# Patient Record
Sex: Female | Born: 1950 | Hispanic: Refuse to answer | Marital: Married | Smoking: Former smoker
Health system: Southern US, Community
[De-identification: ages and names within clinical notes are randomized; demographics above are authoritative.]

## PROBLEM LIST (undated history)

## (undated) ENCOUNTER — Ambulatory Visit

## (undated) ENCOUNTER — Encounter

## (undated) ENCOUNTER — Telehealth

## (undated) ENCOUNTER — Ambulatory Visit: Payer: MEDICARE

## (undated) ENCOUNTER — Encounter
Attending: Student in an Organized Health Care Education/Training Program | Primary: Student in an Organized Health Care Education/Training Program

## (undated) ENCOUNTER — Ambulatory Visit
Attending: Student in an Organized Health Care Education/Training Program | Primary: Student in an Organized Health Care Education/Training Program

## (undated) ENCOUNTER — Ambulatory Visit
Payer: Medicare (Managed Care) | Attending: Student in an Organized Health Care Education/Training Program | Primary: Student in an Organized Health Care Education/Training Program

## (undated) ENCOUNTER — Ambulatory Visit
Payer: MEDICARE | Attending: Student in an Organized Health Care Education/Training Program | Primary: Student in an Organized Health Care Education/Training Program

## (undated) DIAGNOSIS — E785 Hyperlipidemia, unspecified: Secondary | ICD-10-CM

## (undated) DIAGNOSIS — I10 Essential (primary) hypertension: Secondary | ICD-10-CM

## (undated) DIAGNOSIS — M810 Age-related osteoporosis without current pathological fracture: Secondary | ICD-10-CM

## (undated) HISTORY — PX: ABDOMINAL HYSTERECTOMY: SHX81

---

## 2005-03-20 HISTORY — PX: BREAST CYST ASPIRATION: SHX578

## 2010-01-31 ENCOUNTER — Ambulatory Visit: Payer: Self-pay | Admitting: Specialist

## 2010-02-01 ENCOUNTER — Ambulatory Visit: Payer: Self-pay | Admitting: Specialist

## 2011-02-02 ENCOUNTER — Ambulatory Visit: Payer: Self-pay | Admitting: Family Medicine

## 2012-02-06 ENCOUNTER — Ambulatory Visit: Payer: Self-pay | Admitting: Family Medicine

## 2013-02-07 ENCOUNTER — Ambulatory Visit: Payer: Self-pay | Admitting: Family Medicine

## 2014-02-10 ENCOUNTER — Ambulatory Visit: Payer: Self-pay | Admitting: Family Medicine

## 2014-04-03 ENCOUNTER — Ambulatory Visit: Payer: Self-pay | Admitting: Gastroenterology

## 2014-07-13 LAB — SURGICAL PATHOLOGY

## 2015-01-21 ENCOUNTER — Other Ambulatory Visit: Payer: Self-pay | Admitting: Family Medicine

## 2015-01-21 DIAGNOSIS — Z1231 Encounter for screening mammogram for malignant neoplasm of breast: Secondary | ICD-10-CM

## 2015-02-17 ENCOUNTER — Ambulatory Visit
Admission: RE | Admit: 2015-02-17 | Discharge: 2015-02-17 | Disposition: A | Discharge status: Home / Self Care | Payer: No Typology Code available for payment source | Source: Ambulatory Visit | Attending: Family Medicine | Admitting: Family Medicine

## 2015-02-17 DIAGNOSIS — Z1231 Encounter for screening mammogram for malignant neoplasm of breast: Secondary | ICD-10-CM | POA: Insufficient documentation

## 2015-05-16 ENCOUNTER — Emergency Department
Admission: EM | Admit: 2015-05-16 | Discharge: 2015-05-16 | Disposition: A | Discharge status: Home / Self Care | Payer: No Typology Code available for payment source | Attending: Emergency Medicine | Admitting: Emergency Medicine

## 2015-05-16 ENCOUNTER — Encounter: Payer: Self-pay | Admitting: Emergency Medicine

## 2015-05-16 DIAGNOSIS — I10 Essential (primary) hypertension: Secondary | ICD-10-CM | POA: Diagnosis not present

## 2015-05-16 DIAGNOSIS — S161XXA Strain of muscle, fascia and tendon at neck level, initial encounter: Secondary | ICD-10-CM | POA: Diagnosis not present

## 2015-05-16 DIAGNOSIS — Y998 Other external cause status: Secondary | ICD-10-CM | POA: Diagnosis not present

## 2015-05-16 DIAGNOSIS — Y9389 Activity, other specified: Secondary | ICD-10-CM | POA: Diagnosis not present

## 2015-05-16 DIAGNOSIS — Y92410 Unspecified street and highway as the place of occurrence of the external cause: Secondary | ICD-10-CM | POA: Insufficient documentation

## 2015-05-16 DIAGNOSIS — S199XXA Unspecified injury of neck, initial encounter: Secondary | ICD-10-CM | POA: Diagnosis present

## 2015-05-16 HISTORY — DX: Essential (primary) hypertension: I10
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
## 2015-05-16 NOTE — ED Notes (Signed)
AAOx3.  Skin warm and dry.  Ambulates with easy and steady gait.  Moving all extremities equally and strong. 
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
## 2015-12-10 ENCOUNTER — Other Ambulatory Visit: Payer: Self-pay | Admitting: Family Medicine

## 2015-12-10 DIAGNOSIS — Z1231 Encounter for screening mammogram for malignant neoplasm of breast: Secondary | ICD-10-CM

## 2016-02-21 ENCOUNTER — Ambulatory Visit: Admission: RE | Admit: 2016-02-21 | Payer: No Typology Code available for payment source | Source: Ambulatory Visit

## 2016-02-23 ENCOUNTER — Ambulatory Visit
Admission: RE | Admit: 2016-02-23 | Discharge: 2016-02-23 | Disposition: A | Discharge status: Home / Self Care | Payer: BLUE CROSS/BLUE SHIELD | Source: Ambulatory Visit | Attending: Family Medicine | Admitting: Family Medicine

## 2016-02-23 DIAGNOSIS — Z1231 Encounter for screening mammogram for malignant neoplasm of breast: Secondary | ICD-10-CM | POA: Insufficient documentation

## 2016-04-04 ENCOUNTER — Ambulatory Visit
Admission: EM | Admit: 2016-04-04 | Discharge: 2016-04-04 | Disposition: A | Discharge status: Home / Self Care | Payer: BLUE CROSS/BLUE SHIELD | Attending: Emergency Medicine | Admitting: Emergency Medicine

## 2016-04-04 DIAGNOSIS — J069 Acute upper respiratory infection, unspecified: Secondary | ICD-10-CM

## 2016-04-04 MED ORDER — BENZONATATE 200 MG PO CAPS: 200.0000 mg | ORAL_CAPSULE | Freq: Three times a day (TID) | ORAL | 0 refills | Status: DC

## 2016-04-04 MED ORDER — CETIRIZINE-PSEUDOEPHEDRINE ER 5-120 MG PO TB12: 1.0000 | ORAL_TABLET | Freq: Two times a day (BID) | ORAL | 0 refills | Status: DC

## 2016-04-04 NOTE — ED Provider Notes (Signed)
CSN: [id number]     Arrival date & time 04/04/16  1033 History   First MD Initiated Contact with Patient 04/04/16 1317     Chief Complaint  Patient presents with  . Cough   (Consider location/radiation/quality/duration/timing/severity/associated sxs/prior Treatment) HPI   This a 66 year old female who presents with cough and congestion started 4 days ago. Also been having some sinus pressure and pain with nasal drainage. Is not coughing at nighttime excessively. She's not had chills but has felt feverish although she has not checked it. She stopped smoking 30 years ago. Her cough is productive of some Ostrovsky sputum.  Past Medical History:  Diagnosis Date  . Hypertension    Past Surgical History:  Procedure Laterality Date  . BREAST CYST ASPIRATION Left 2007   neg   Family History  Problem Relation Age of Onset  . Hypertension Mother   . CAD Mother   . CAD Father    Social History  Substance Use Topics  . Smoking status: Passive Smoke Exposure - Never Smoker  . Smokeless tobacco: Never Used  . Alcohol use No     Comment: social   OB History    No data available     Review of Systems  Constitutional: Positive for activity change, fatigue and fever. Negative for chills.  HENT: Positive for congestion, postnasal drip, sinus pain, sinus pressure and sore throat.   Respiratory: Positive for cough. Negative for shortness of breath, wheezing and stridor.   All other systems reviewed and are negative.   Allergies  Sulfa antibiotics  Home Medications   Prior to Admission medications   Medication Sig Start Date End Date Taking? Authorizing Provider  fluticasone (FLONASE) 50 MCG/ACT nasal spray Place into both nostrils daily.   Yes Historical Provider, MD  lisinopril (PRINIVIL,ZESTRIL) 20 MG tablet Take 20 mg by mouth daily.   Yes Historical Provider, MD  lovastatin (MEVACOR) 40 MG tablet Take 40 mg by mouth at bedtime.   Yes Historical Provider, MD  benzonatate (TESSALON)  200 MG capsule Take 1 capsule (200 mg total) by mouth every 8 (eight) hours. As necessary for cough 04/04/16   Lutricia FeilWilliam P Drakkar Medeiros, PA-C  cetirizine-pseudoephedrine (ZYRTEC-D) 5-120 MG tablet Take 1 tablet by mouth 2 (two) times daily. 04/04/16   Lutricia FeilWilliam P [place]ola Wheat, PA-C   Meds Ordered and Administered this Visit  Medications - No data to display  BP 138/76 (BP Location: Left Arm)   Pulse (!) 107   Temp 99.4 F (37.4 C) (Oral)   Resp 18   Ht 5' 5.5" (1.664 m)   Wt 140 lb (63.5 kg)   SpO2 100%   BMI 22.94 kg/m  No data found.   Physical Exam  Constitutional: She is oriented to person, place, and time. She appears well-developed and well-nourished. No distress.  HENT:  Head: Normocephalic and atraumatic.  Right Ear: External ear normal.  Left Ear: External ear normal.  Nose: Nose normal.  Mouth/Throat: Oropharynx is clear and moist. No oropharyngeal exudate.  Eyes: EOM are normal. Pupils are equal, round, and reactive to light. Right eye exhibits no discharge. Left eye exhibits no discharge.  Neck: Normal range of motion. Neck supple.  Pulmonary/Chest: Effort normal and breath sounds normal. No respiratory distress. She has no wheezes. She has no rales.  Musculoskeletal: Normal range of motion.  Lymphadenopathy:    She has no cervical adenopathy.  Neurological: She is alert and oriented to person, place, and time.  Skin: Skin is warm and dry. She  is not diaphoretic.  Psychiatric: She has a normal mood and affect. Her behavior is normal. Judgment and thought content normal.  Nursing note and vitals reviewed.   Urgent Care Course   Clinical Course     Procedures (including critical care time)  Labs Review Labs Reviewed - No data to display  Imaging Review No results found.   Visual Acuity Review  Right Eye Distance:   Left Eye Distance:   Bilateral Distance:    Right Eye Near:   Left Eye Near:    Bilateral Near:         MDM   1. Acute upper respiratory  infection    Discharge Medication List as of 04/04/2016  1:33 PM    START taking these medications   Details  benzonatate (TESSALON) 200 MG capsule Take 1 capsule (200 mg total) by mouth every 8 (eight) hours. As necessary for cough, Starting Tue 04/04/2016, Normal    cetirizine-pseudoephedrine (ZYRTEC-D) 5-120 MG tablet Take 1 tablet by mouth 2 (two) times daily., Starting Tue 04/04/2016, Normal      Plan: 1. Test/x-ray results and diagnosis reviewed with patient 2. rx as per orders; risks, benefits, potential side effects reviewed with patient 3. Recommend supportive treatment with Rest and fluids. Use Flonase daily for the next 3-4 weeks. Follow-up with the primary care physician if she continues to have symptoms 4. F/u prn if symptoms worsen or don't improve     Lutricia Feil, PA-C 04/04/16 1342

## 2016-04-04 NOTE — ED Triage Notes (Signed)
Patient complains of cough and congestion that started on Friday. Patient states that she has also had sinus pain and pressure. Patient states that she feels like the cold has settled into her chest.

## 2016-04-14 ENCOUNTER — Encounter: Payer: Self-pay | Admitting: Emergency Medicine

## 2016-04-14 ENCOUNTER — Ambulatory Visit
Admission: EM | Admit: 2016-04-14 | Discharge: 2016-04-14 | Disposition: A | Discharge status: Home / Self Care | Payer: BLUE CROSS/BLUE SHIELD | Attending: Family Medicine | Admitting: Family Medicine

## 2016-04-14 DIAGNOSIS — J40 Bronchitis, not specified as acute or chronic: Secondary | ICD-10-CM

## 2016-04-14 DIAGNOSIS — R059 Cough, unspecified: Secondary | ICD-10-CM

## 2016-04-14 DIAGNOSIS — R05 Cough: Secondary | ICD-10-CM | POA: Diagnosis not present

## 2016-04-14 MED ORDER — AZITHROMYCIN 250 MG PO TABS: ORAL_TABLET | ORAL | 0 refills | Status: DC

## 2016-04-14 MED ORDER — GUAIFENESIN-CODEINE 100-10 MG/5ML PO SOLN: ORAL | 0 refills | Status: DC

## 2016-04-14 NOTE — ED Provider Notes (Signed)
[facility]    CSN: [id number] Arrival date & time: 04/14/16  4098     History   Chief Complaint Chief Complaint  Patient presents with  . Cough    HPI Debra Alexander is a 66 y.o. female.   The history is provided by the patient.  Cough  Associated symptoms: fever   Associated symptoms: no headaches and no wheezing   URI  Presenting symptoms: cough, fatigue and fever   Severity:  Moderate Onset quality:  Sudden Duration:  14 days Timing:  Constant Progression:  Worsening Chronicity:  New Relieved by:  Nothing Ineffective treatments:  Prescription medications and OTC medications Associated symptoms: no headaches, no sinus pain and no wheezing   Risk factors: sick contacts   Risk factors: not elderly, no chronic cardiac disease, no chronic kidney disease, no chronic respiratory disease, no diabetes mellitus, no immunosuppression, no recent illness and no recent travel     Past Medical History:  Diagnosis Date  . Hypertension     There are no active problems to display for this patient.   Past Surgical History:  Procedure Laterality Date  . BREAST CYST ASPIRATION Left 2007   neg    OB History    No data available       Home Medications    Prior to Admission medications   Medication Sig Start Date End Date Taking? Authorizing Provider  azithromycin (ZITHROMAX Z-PAK) 250 MG tablet 2 tabs po once day 1, then 1 tab po qd for next 4 days 04/14/16   Payton Mccallum, MD  benzonatate (TESSALON) 200 MG capsule Take 1 capsule (200 mg total) by mouth every 8 (eight) hours. As necessary for cough 04/04/16   Lutricia Feil, PA-C  cetirizine-pseudoephedrine (ZYRTEC-D) 5-120 MG tablet Take 1 tablet by mouth 2 (two) times daily. 04/04/16   Lutricia Feil, PA-C  fluticasone (FLONASE) 50 MCG/ACT nasal spray Place into both nostrils daily.    Historical Provider, MD  guaiFENesin-codeine 100-10 MG/5ML syrup 10 mls po bid prn cough 04/14/16   Payton Mccallum, MD    lisinopril (PRINIVIL,ZESTRIL) 20 MG tablet Take 20 mg by mouth daily.    Historical Provider, MD  lovastatin (MEVACOR) 40 MG tablet Take 40 mg by mouth at bedtime.    Historical Provider, MD    Family History Family History  Problem Relation Age of Onset  . Hypertension Mother   . CAD Mother   . CAD Father     Social History Social History  Substance Use Topics  . Smoking status: Passive Smoke Exposure - Never Smoker  . Smokeless tobacco: Never Used  . Alcohol use No     Comment: social     Allergies   Sulfa antibiotics   Review of Systems Review of Systems  Constitutional: Positive for fatigue and fever.  HENT: Negative for sinus pain.   Respiratory: Positive for cough. Negative for wheezing.   Neurological: Negative for headaches.     Physical Exam Triage Vital Signs ED Triage Vitals  Enc Vitals Group     BP 04/14/16 0955 134/84     Pulse Rate 04/14/16 0955 97     Resp 04/14/16 0955 16     Temp 04/14/16 0955 99.8 F (37.7 C)     Temp Source 04/14/16 0955 Oral     SpO2 04/14/16 0955 98 %     Weight 04/14/16 0954 140 lb (63.5 kg)     Height 04/14/16 0954 5\' 5"  (1.651 m)  Head Circumference --      Peak Flow --      Pain Score 04/14/16 0955 0     Pain Loc --      Pain Edu? --      Excl. in GC? --    No data found.   Updated Vital Signs BP 134/84 (BP Location: Right Arm)   Pulse 97   Temp 99.8 F (37.7 C) (Oral)   Resp 16   Ht 5\' 5"  (1.651 m)   Wt 140 lb (63.5 kg)   SpO2 98%   BMI 23.30 kg/m   Visual Acuity Right Eye Distance:   Left Eye Distance:   Bilateral Distance:    Right Eye Near:   Left Eye Near:    Bilateral Near:     Physical Exam  Constitutional: She appears well-developed and well-nourished. No distress.  HENT:  Head: Normocephalic and atraumatic.  Right Ear: Tympanic membrane, external ear and ear canal normal.  Left Ear: Tympanic membrane, external ear and ear canal normal.  Nose: Mucosal edema and rhinorrhea  present. No nose lacerations, sinus tenderness, nasal deformity, septal deviation or nasal septal hematoma. No epistaxis.  No foreign bodies. Right sinus exhibits maxillary sinus tenderness and frontal sinus tenderness. Left sinus exhibits maxillary sinus tenderness and frontal sinus tenderness.  Mouth/Throat: Uvula is midline, oropharynx is clear and moist and mucous membranes are normal. No oropharyngeal exudate.  Eyes: Conjunctivae and EOM are normal. Pupils are equal, round, and reactive to light. Right eye exhibits no discharge. Left eye exhibits no discharge. No scleral icterus.  Neck: Normal range of motion. Neck supple. No thyromegaly present.  Cardiovascular: Normal rate, regular rhythm and normal heart sounds.   Pulmonary/Chest: Effort normal. No respiratory distress. She has no wheezes. She has rales (left base).  Lymphadenopathy:    She has no cervical adenopathy.  Skin: She is not diaphoretic.  Nursing note and vitals reviewed.    UC Treatments / Results  Labs (all labs ordered are listed, but only abnormal results are displayed) Labs Reviewed - No data to display  EKG  EKG Interpretation None       Radiology No results found.  Procedures Procedures (including critical care time)  Medications Ordered in UC Medications - No data to display   Initial Impression / Assessment and Plan / UC Course  I have reviewed the triage vital signs and the nursing notes.  Pertinent labs & imaging results that were available during my care of the patient were reviewed by me and considered in my medical decision making (see chart for details).       Final Clinical Impressions(s) / UC Diagnoses   Final diagnoses:  Cough  Bronchitis    New Prescriptions Discharge Medication List as of 04/14/2016 10:18 AM    START taking these medications   Details  azithromycin (ZITHROMAX Z-PAK) 250 MG tablet 2 tabs po once day 1, then 1 tab po qd for next 4 days, Normal      guaiFENesin-codeine 100-10 MG/5ML syrup 10 mls po bid prn cough, Print       1. diagnosis reviewed with patient 2. rx as per orders above; reviewed possible side effects, interactions, risks and benefits  3. Recommend supportive treatment with rest, fluids 4. Follow-up prn if symptoms worsen or don't improve   Payton Mccallumrlando Belisa Eichholz, MD 04/14/16 1217

## 2016-04-14 NOTE — ED Triage Notes (Signed)
Patient c/o cough and congestion for over a week.  Patient states that she was seen on Jan. 16 and is not getting any better. Patient denies fevers.

## 2017-01-24 ENCOUNTER — Other Ambulatory Visit: Payer: Self-pay | Admitting: Family Medicine

## 2017-01-24 DIAGNOSIS — Z1231 Encounter for screening mammogram for malignant neoplasm of breast: Secondary | ICD-10-CM

## 2017-03-07 ENCOUNTER — Ambulatory Visit
Admission: RE | Admit: 2017-03-07 | Discharge: 2017-03-07 | Disposition: A | Discharge status: Home / Self Care | Payer: Medicare HMO | Source: Ambulatory Visit | Attending: Family Medicine | Admitting: Family Medicine

## 2017-03-07 DIAGNOSIS — Z1231 Encounter for screening mammogram for malignant neoplasm of breast: Secondary | ICD-10-CM

## 2017-03-14 DIAGNOSIS — R87610 Atypical squamous cells of undetermined significance on cytologic smear of cervix (ASC-US): Secondary | ICD-10-CM | POA: Diagnosis not present

## 2017-03-14 DIAGNOSIS — M25551 Pain in right hip: Secondary | ICD-10-CM | POA: Diagnosis not present

## 2017-03-14 DIAGNOSIS — Z8541 Personal history of malignant neoplasm of cervix uteri: Secondary | ICD-10-CM | POA: Diagnosis not present

## 2017-03-14 DIAGNOSIS — I10 Essential (primary) hypertension: Secondary | ICD-10-CM | POA: Diagnosis not present

## 2017-03-14 DIAGNOSIS — N951 Menopausal and female climacteric states: Secondary | ICD-10-CM | POA: Diagnosis not present

## 2017-03-14 DIAGNOSIS — J302 Other seasonal allergic rhinitis: Secondary | ICD-10-CM | POA: Diagnosis not present

## 2017-03-14 DIAGNOSIS — K219 Gastro-esophageal reflux disease without esophagitis: Secondary | ICD-10-CM | POA: Diagnosis not present

## 2017-03-14 DIAGNOSIS — Z01419 Encounter for gynecological examination (general) (routine) without abnormal findings: Secondary | ICD-10-CM | POA: Diagnosis not present

## 2017-03-14 DIAGNOSIS — E7800 Pure hypercholesterolemia, unspecified: Secondary | ICD-10-CM | POA: Diagnosis not present

## 2017-03-14 DIAGNOSIS — Z23 Encounter for immunization: Secondary | ICD-10-CM | POA: Diagnosis not present

## 2017-03-16 DIAGNOSIS — Z01419 Encounter for gynecological examination (general) (routine) without abnormal findings: Secondary | ICD-10-CM | POA: Diagnosis not present

## 2017-03-16 DIAGNOSIS — Z79899 Other long term (current) drug therapy: Secondary | ICD-10-CM | POA: Diagnosis not present

## 2017-03-16 DIAGNOSIS — I10 Essential (primary) hypertension: Secondary | ICD-10-CM | POA: Diagnosis not present

## 2017-03-16 DIAGNOSIS — L659 Nonscarring hair loss, unspecified: Secondary | ICD-10-CM | POA: Diagnosis not present

## 2017-03-16 DIAGNOSIS — E7800 Pure hypercholesterolemia, unspecified: Secondary | ICD-10-CM | POA: Diagnosis not present

## 2017-04-10 DIAGNOSIS — R87629 Unspecified abnormal cytological findings in specimens from vagina: Secondary | ICD-10-CM | POA: Diagnosis not present

## 2017-10-09 DIAGNOSIS — H524 Presbyopia: Secondary | ICD-10-CM | POA: Diagnosis not present

## 2017-10-10 DIAGNOSIS — Z0100 Encounter for examination of eyes and vision without abnormal findings: Secondary | ICD-10-CM | POA: Diagnosis not present

## 2017-10-11 DIAGNOSIS — E7800 Pure hypercholesterolemia, unspecified: Secondary | ICD-10-CM | POA: Diagnosis not present

## 2017-10-11 DIAGNOSIS — J302 Other seasonal allergic rhinitis: Secondary | ICD-10-CM | POA: Diagnosis not present

## 2017-10-11 DIAGNOSIS — K219 Gastro-esophageal reflux disease without esophagitis: Secondary | ICD-10-CM | POA: Diagnosis not present

## 2017-10-11 DIAGNOSIS — I10 Essential (primary) hypertension: Secondary | ICD-10-CM | POA: Diagnosis not present

## 2018-06-05 DIAGNOSIS — Z8669 Personal history of other diseases of the nervous system and sense organs: Secondary | ICD-10-CM | POA: Diagnosis not present

## 2018-06-05 DIAGNOSIS — N951 Menopausal and female climacteric states: Secondary | ICD-10-CM | POA: Diagnosis not present

## 2018-06-05 DIAGNOSIS — K219 Gastro-esophageal reflux disease without esophagitis: Secondary | ICD-10-CM | POA: Diagnosis not present

## 2018-06-05 DIAGNOSIS — Z131 Encounter for screening for diabetes mellitus: Secondary | ICD-10-CM | POA: Diagnosis not present

## 2018-06-05 DIAGNOSIS — Z87891 Personal history of nicotine dependence: Secondary | ICD-10-CM | POA: Diagnosis not present

## 2018-06-05 DIAGNOSIS — J302 Other seasonal allergic rhinitis: Secondary | ICD-10-CM | POA: Diagnosis not present

## 2018-06-05 DIAGNOSIS — D709 Neutropenia, unspecified: Secondary | ICD-10-CM | POA: Diagnosis not present

## 2018-06-05 DIAGNOSIS — Z0000 Encounter for general adult medical examination without abnormal findings: Secondary | ICD-10-CM | POA: Diagnosis not present

## 2018-06-05 DIAGNOSIS — E7800 Pure hypercholesterolemia, unspecified: Secondary | ICD-10-CM | POA: Diagnosis not present

## 2018-06-05 DIAGNOSIS — G4700 Insomnia, unspecified: Secondary | ICD-10-CM | POA: Diagnosis not present

## 2018-06-05 DIAGNOSIS — I10 Essential (primary) hypertension: Secondary | ICD-10-CM | POA: Diagnosis not present

## 2018-06-05 DIAGNOSIS — M25552 Pain in left hip: Secondary | ICD-10-CM | POA: Diagnosis not present

## 2018-06-07 ENCOUNTER — Other Ambulatory Visit: Payer: Self-pay | Admitting: Gerontology

## 2018-06-07 DIAGNOSIS — Z0000 Encounter for general adult medical examination without abnormal findings: Secondary | ICD-10-CM

## 2018-09-17 DIAGNOSIS — M810 Age-related osteoporosis without current pathological fracture: Secondary | ICD-10-CM | POA: Diagnosis not present

## 2018-12-12 DIAGNOSIS — Z8541 Personal history of malignant neoplasm of cervix uteri: Secondary | ICD-10-CM | POA: Diagnosis not present

## 2018-12-12 DIAGNOSIS — Z0000 Encounter for general adult medical examination without abnormal findings: Secondary | ICD-10-CM | POA: Diagnosis not present

## 2018-12-12 DIAGNOSIS — D709 Neutropenia, unspecified: Secondary | ICD-10-CM | POA: Diagnosis not present

## 2018-12-12 DIAGNOSIS — Z1329 Encounter for screening for other suspected endocrine disorder: Secondary | ICD-10-CM | POA: Diagnosis not present

## 2018-12-12 DIAGNOSIS — G4700 Insomnia, unspecified: Secondary | ICD-10-CM | POA: Diagnosis not present

## 2018-12-12 DIAGNOSIS — Z87891 Personal history of nicotine dependence: Secondary | ICD-10-CM | POA: Diagnosis not present

## 2018-12-12 DIAGNOSIS — E7800 Pure hypercholesterolemia, unspecified: Secondary | ICD-10-CM | POA: Diagnosis not present

## 2018-12-12 DIAGNOSIS — M810 Age-related osteoporosis without current pathological fracture: Secondary | ICD-10-CM | POA: Diagnosis not present

## 2018-12-12 DIAGNOSIS — K219 Gastro-esophageal reflux disease without esophagitis: Secondary | ICD-10-CM | POA: Diagnosis not present

## 2018-12-12 DIAGNOSIS — I10 Essential (primary) hypertension: Secondary | ICD-10-CM | POA: Diagnosis not present

## 2018-12-12 DIAGNOSIS — J302 Other seasonal allergic rhinitis: Secondary | ICD-10-CM | POA: Diagnosis not present

## 2018-12-12 DIAGNOSIS — R7309 Other abnormal glucose: Secondary | ICD-10-CM | POA: Diagnosis not present

## 2018-12-12 DIAGNOSIS — Z7189 Other specified counseling: Secondary | ICD-10-CM | POA: Diagnosis not present

## 2019-02-07 ENCOUNTER — Other Ambulatory Visit: Payer: Self-pay

## 2019-02-07 DIAGNOSIS — Z20822 Contact with and (suspected) exposure to covid-19: Secondary | ICD-10-CM

## 2019-02-10 LAB — NOVEL CORONAVIRUS, NAA: SARS-CoV-2, NAA: NOT DETECTED

## 2019-04-14 ENCOUNTER — Encounter: Admit: 2019-04-14 | Discharge: 2019-04-15 | Payer: MEDICARE

## 2019-05-12 ENCOUNTER — Encounter: Admit: 2019-05-12 | Discharge: 2019-05-13 | Payer: MEDICARE

## 2019-06-11 DIAGNOSIS — M810 Age-related osteoporosis without current pathological fracture: Secondary | ICD-10-CM | POA: Diagnosis not present

## 2019-06-11 DIAGNOSIS — K219 Gastro-esophageal reflux disease without esophagitis: Secondary | ICD-10-CM | POA: Diagnosis not present

## 2019-06-11 DIAGNOSIS — J302 Other seasonal allergic rhinitis: Secondary | ICD-10-CM | POA: Diagnosis not present

## 2019-06-11 DIAGNOSIS — R7303 Prediabetes: Secondary | ICD-10-CM | POA: Diagnosis not present

## 2019-06-11 DIAGNOSIS — G4700 Insomnia, unspecified: Secondary | ICD-10-CM | POA: Diagnosis not present

## 2019-06-11 DIAGNOSIS — I10 Essential (primary) hypertension: Secondary | ICD-10-CM | POA: Diagnosis not present

## 2019-06-11 DIAGNOSIS — Z1239 Encounter for other screening for malignant neoplasm of breast: Secondary | ICD-10-CM | POA: Diagnosis not present

## 2019-06-11 DIAGNOSIS — Z7189 Other specified counseling: Secondary | ICD-10-CM | POA: Diagnosis not present

## 2019-06-11 DIAGNOSIS — Z87891 Personal history of nicotine dependence: Secondary | ICD-10-CM | POA: Diagnosis not present

## 2019-06-12 ENCOUNTER — Other Ambulatory Visit: Payer: Self-pay | Admitting: Gerontology

## 2019-06-12 DIAGNOSIS — Z1231 Encounter for screening mammogram for malignant neoplasm of breast: Secondary | ICD-10-CM

## 2019-08-25 ENCOUNTER — Ambulatory Visit
Admission: EM | Admit: 2019-08-25 | Discharge: 2019-08-25 | Disposition: A | Discharge status: Home / Self Care | Payer: Medicare Other | Attending: Family Medicine | Admitting: Family Medicine

## 2019-08-25 ENCOUNTER — Other Ambulatory Visit: Payer: Self-pay

## 2019-08-25 DIAGNOSIS — J069 Acute upper respiratory infection, unspecified: Secondary | ICD-10-CM | POA: Diagnosis not present

## 2019-08-25 MED ORDER — IPRATROPIUM BROMIDE 0.06 % NA SOLN: 2.0000 | Freq: Four times a day (QID) | NASAL | 0 refills | Status: DC | PRN

## 2019-08-25 NOTE — ED Triage Notes (Signed)
Pt states facial pressure/pain and right ear pain since Saturday

## 2019-08-25 NOTE — Discharge Instructions (Signed)
Daily Zyrtec.  Atrovent as prescribed.  Lots of fluids.  Nasal saline as well.  Take care  Dr. Adriana Simas

## 2019-08-25 NOTE — ED Provider Notes (Signed)
[facility]    CSN: [id number] Arrival date & time: 08/25/19  0849  History   Chief Complaint Chief Complaint  Patient presents with   Facial Pain   Otalgia   HPI 69 year old female presents with the above complaints.  Symptoms started on Saturday.  Patient reports sinus pressure and pain as well as right ear pain.  Also reports rhinorrhea and congestion.  No fever.  She has been taking Tylenol Cold and sinus and Flonase without resolution.  No known inciting factor.  No known exacerbating factors.  No other associated symptoms.  No other complaints.  Past Medical History:  Diagnosis Date   Hypertension    Past Surgical History:  Procedure Laterality Date   ABDOMINAL HYSTERECTOMY     BREAST CYST ASPIRATION Left 2007   neg    OB History   No obstetric history on file.      Home Medications    Prior to Admission medications   Medication Sig Start Date End Date Taking? Authorizing Provider  alendronate (FOSAMAX) 70 MG tablet Take by mouth. 12/12/18 12/12/19 Yes [provider]  metoprolol succinate (TOPROL-XL) 25 MG 24 hr tablet TAKE 1 TABLET BY MOUTH EVERY DAY 09/29/10  Yes [provider]  fluticasone (FLONASE) 50 MCG/ACT nasal spray Place into both nostrils daily.    [provider]  ipratropium (ATROVENT) 0.06 % nasal spray Place 2 sprays into both nostrils 4 (four) times daily as needed for rhinitis. 08/25/19   Tommie Sams, DO  lisinopril (PRINIVIL,ZESTRIL) 20 MG tablet Take 20 mg by mouth daily.    [provider]  lovastatin (MEVACOR) 40 MG tablet Take 40 mg by mouth at bedtime.    [provider]    Family History Family History  Problem Relation Age of Onset   Hypertension Mother    CAD Mother    CAD Father    Breast cancer Neg Hx     Social History Social History   Tobacco Use   Smoking status: Former Smoker   Smokeless tobacco: Never Used  Substance Use Topics   Alcohol use: Yes   Comment: social   Drug use: No     Allergies   Sulfa antibiotics   Review of Systems Review of Systems  Constitutional: Negative for fever.  HENT: Positive for congestion, rhinorrhea, sinus pressure and sinus pain.    Physical Exam Triage Vital Signs ED Triage Vitals  Enc Vitals Group     BP 08/25/19 0905 (!) 185/113     Pulse Rate 08/25/19 0905 93     Resp 08/25/19 0905 16     Temp 08/25/19 0905 99 F (37.2 C)     Temp Source 08/25/19 0905 Oral     SpO2 08/25/19 0905 100 %     Weight 08/25/19 0907 153 lb (69.4 kg)     Height 08/25/19 0907 5\' 4"  (1.626 m)     Head Circumference --      Peak Flow --      Pain Score 08/25/19 0907 1     Pain Loc --      Pain Edu? --      Excl. in GC? --    Updated Vital Signs BP (!) 185/113 (BP Location: Left Arm) Comment: HAS NOT TAKEN EITHER B/P MED YET TODAY   Pulse 93    Temp 99 F (37.2 C) (Oral)    Resp 16    Ht 5\' 4"  (1.626 m)    Wt 69.4 kg  SpO2 100%    BMI 26.26 kg/m   Visual Acuity Right Eye Distance:   Left Eye Distance:   Bilateral Distance:    Right Eye Near:   Left Eye Near:    Bilateral Near:     Physical Exam Constitutional:      General: She is not in acute distress.    Appearance: Normal appearance. She is not ill-appearing.  HENT:     Head: Normocephalic and atraumatic.     Right Ear: Tympanic membrane normal.     Left Ear: Tympanic membrane normal.     Nose: No rhinorrhea.  Eyes:     General:        Right eye: No discharge.        Left eye: No discharge.     Conjunctiva/sclera: Conjunctivae normal.  Cardiovascular:     Rate and Rhythm: Normal rate and regular rhythm.     Heart sounds: No murmur.  Pulmonary:     Effort: Pulmonary effort is normal.     Breath sounds: Normal breath sounds. No wheezing, rhonchi or rales.  Neurological:     Mental Status: She is alert.  Psychiatric:        Mood and Affect: Mood normal.        Behavior: Behavior normal.    UC Treatments / Results  Labs (all  labs ordered are listed, but only abnormal results are displayed) Labs Reviewed - No data to display  EKG   Radiology No results found.  Procedures Procedures (including critical care time)  Medications Ordered in UC Medications - No data to display  Initial Impression / Assessment and Plan / UC Course  I have reviewed the triage vital signs and the nursing notes.  Pertinent labs & imaging results that were available during my care of the patient were reviewed by me and considered in my medical decision making (see chart for details).    69 year old female presents with viral URI.  Atrovent nasal spray as prescribed.  Daily Zyrtec.  Supportive care.  Final Clinical Impressions(s) / UC Diagnoses   Final diagnoses:  Upper respiratory tract infection, unspecified type     Discharge Instructions     Daily Zyrtec.  Atrovent as prescribed.  Lots of fluids.  Nasal saline as well.  Take care  Dr. Lacinda Axon    ED Prescriptions    Medication Sig Dispense Auth. Provider   ipratropium (ATROVENT) 0.06 % nasal spray Place 2 sprays into both nostrils 4 (four) times daily as needed for rhinitis. 15 mL Coral Spikes, DO     PDMP not reviewed this encounter.   Coral Spikes, [place] 08/25/19 1105

## 2019-11-12 ENCOUNTER — Encounter: Payer: Self-pay | Admitting: Emergency Medicine

## 2019-11-12 ENCOUNTER — Other Ambulatory Visit: Payer: Self-pay

## 2019-11-12 ENCOUNTER — Ambulatory Visit
Admission: EM | Admit: 2019-11-12 | Discharge: 2019-11-12 | Disposition: A | Discharge status: Home / Self Care | Payer: Medicare Other | Attending: Family Medicine | Admitting: Family Medicine

## 2019-11-12 DIAGNOSIS — L255 Unspecified contact dermatitis due to plants, except food: Secondary | ICD-10-CM | POA: Diagnosis not present

## 2019-11-12 HISTORY — DX: Hyperlipidemia, unspecified: E78.5

## 2019-11-12 HISTORY — DX: Age-related osteoporosis without current pathological fracture: M81.0

## 2019-11-12 MED ORDER — PREDNISONE 10 MG PO TABS: ORAL_TABLET | ORAL | 0 refills | Status: DC

## 2019-11-12 NOTE — ED Triage Notes (Addendum)
Patient in today c/o 5 day history of rash/hives to her hands and arms. Patient has been applying OTC Hydrocortisone cream and has been mainly using Triamcinolone Cream. Patient denies any new soaps, lotions, detergents. Patient states she started taking a new vitamin mid-July.

## 2019-11-12 NOTE — ED Provider Notes (Signed)
[facility]    CSN: [id number] Arrival date & time: 11/12/19  8466      History   Chief Complaint Chief Complaint  Patient presents with  . Rash   HPI  69 year old female presents with a rash.  Started on Friday and has not improved.  It is located on her arms and hands.  She has been applying hydrocortisone and triamcinolone without relief.  Rash is pruritic. No new exposures or changes.  No known exacerbating factors.  No other reported symptoms.  No other complaints.  Past Medical History:  Diagnosis Date  . Hyperlipidemia   . Hypertension   . Osteoporosis    Past Surgical History:  Procedure Laterality Date  . ABDOMINAL HYSTERECTOMY    . BREAST CYST ASPIRATION Left 2007   neg    OB History   No obstetric history on file.      Home Medications    Prior to Admission medications   Medication Sig Start Date End Date Taking? Authorizing Provider  alendronate (FOSAMAX) 70 MG tablet Take by mouth. 12/12/18 12/12/19 Yes [provider]  Calcium Carb-Cholecalciferol 600-400 MG-UNIT CAPS Take by mouth. 10/15/12  Yes [provider]  fluticasone (FLONASE) 50 MCG/ACT nasal spray Place into both nostrils daily.   Yes [provider]  ipratropium (ATROVENT) 0.06 % nasal spray Place 2 sprays into both nostrils 4 (four) times daily as needed for rhinitis. 08/25/19  Yes Barbara Ahart G, DO  lisinopril (PRINIVIL,ZESTRIL) 20 MG tablet Take 20 mg by mouth daily.   Yes [provider]  lovastatin (MEVACOR) 40 MG tablet Take 40 mg by mouth at bedtime.   Yes [provider]  metoprolol succinate (TOPROL-XL) 25 MG 24 hr tablet TAKE 1 TABLET BY MOUTH EVERY DAY 09/29/10  Yes [provider]  Multiple Vitamin (MULTIVITAMIN) tablet Take 1 tablet by mouth daily.   Yes [provider]  triamcinolone cream (KENALOG) 0.1 % Apply thin film topically 2 times daily as needed to eczema like skin rash 04/18/19  Yes [provider]  predniSONE (DELTASONE) 10 MG tablet 50 mg daily x 3 days, then 40 mg daily x 3 days, then 30 mg daily x 3 days, then 20 mg daily x 3 days, then 10 mg daily x 3 days. 11/12/19   Tommie Sams, DO    Family History Family History  Problem Relation Age of Onset  . Hypertension Mother   . CAD Mother   . CAD Father   . Breast cancer Neg Hx     Social History Social History   Tobacco Use  . Smoking status: Former Smoker    Packs/day: 1.00    Years: 25.00    Pack years: 25.00    Types: Cigarettes    Quit date: 11/11/1989    Years since quitting: 30.0  . Smokeless tobacco: Never Used  Vaping Use  . Vaping Use: Never used  Substance Use Topics  . Alcohol use: Yes    Comment: social  . Drug use: No     Allergies   Azithromycin, Atorvastatin, Sulfa antibiotics, Zolpidem, and Amoxicillin   Review of Systems Review of Systems  Constitutional: Negative.   Skin: Positive for rash.   Physical Exam Triage Vital Signs ED Triage Vitals [11/12/19 0847]  Enc Vitals Group     BP (!) 173/103     Pulse Rate 96     Resp 18     Temp 98.8 F (37.1 C)  Temp Source Oral     SpO2 100 %     Weight 150 lb (68 kg)     Height 5\' 4"  (1.626 m)     Head Circumference      Peak Flow      Pain Score 0     Pain Loc      Pain Edu?      Excl. in GC?    Updated Vital Signs BP (!) 175/108 (BP Location: Right Arm)   Pulse 96   Temp 98.8 F (37.1 C) (Oral)   Resp 18   Ht 5\' 4"  (1.626 m)   Wt 68 kg   SpO2 100%   BMI 25.75 kg/m   Visual Acuity Right Eye Distance:   Left Eye Distance:   Bilateral Distance:    Right Eye Near:   Left Eye Near:    Bilateral Near:     Physical Exam Vitals and nursing note reviewed.  Constitutional:      General: She is not in acute distress.    Appearance: Normal appearance. She is not ill-appearing.  HENT:     Head: Normocephalic and atraumatic.  Eyes:     General:        Right eye: No discharge.        Left eye: No  discharge.     Conjunctiva/sclera: Conjunctivae normal.  Pulmonary:     Effort: Pulmonary effort is normal. No respiratory distress.  Skin:    Comments: Hands, Forearms, and upper arms with erythematous vesicular rash.  Neurological:     Mental Status: She is alert.  Psychiatric:        Mood and Affect: Mood normal.        Behavior: Behavior normal.    UC Treatments / Results  Labs (all labs ordered are listed, but only abnormal results are displayed) Labs Reviewed - No data to display  EKG   Radiology No results found.  Procedures Procedures (including critical care time)  Medications Ordered in UC Medications - No data to display  Initial Impression / Assessment and Plan / UC Course  I have reviewed the triage vital signs and the nursing notes.  Pertinent labs & imaging results that were available during my care of the patient were reviewed by me and considered in my medical decision making (see chart for details).    69 year old female presents with a vesicular rash which appears to be consistent with poison oak/ivy/sumac.  Treating with prednisone.  Final Clinical Impressions(s) / UC Diagnoses   Final diagnoses:  Dermatitis due to plants, including poison ivy, sumac, and oak   Discharge Instructions   None    ED Prescriptions    Medication Sig Dispense Auth. Provider   predniSONE (DELTASONE) 10 MG tablet 50 mg daily x 3 days, then 40 mg daily x 3 days, then 30 mg daily x 3 days, then 20 mg daily x 3 days, then 10 mg daily x 3 days. 45 tablet , DO     PDMP not reviewed this encounter.   [address], [place] Other 11/12/19 [phone]

## 2020-03-08 ENCOUNTER — Other Ambulatory Visit: Payer: Self-pay

## 2020-03-08 ENCOUNTER — Encounter: Payer: Self-pay | Admitting: Emergency Medicine

## 2020-03-08 ENCOUNTER — Ambulatory Visit
Admission: EM | Admit: 2020-03-08 | Discharge: 2020-03-08 | Disposition: A | Discharge status: Home / Self Care | Payer: Medicare Other | Attending: Sports Medicine | Admitting: Sports Medicine

## 2020-03-08 DIAGNOSIS — T7840XA Allergy, unspecified, initial encounter: Secondary | ICD-10-CM | POA: Diagnosis not present

## 2020-03-08 DIAGNOSIS — R21 Rash and other nonspecific skin eruption: Secondary | ICD-10-CM

## 2020-03-08 MED ORDER — PREDNISONE 10 MG PO TABS: ORAL_TABLET | ORAL | 0 refills | Status: DC

## 2020-03-08 MED ORDER — METHYLPREDNISOLONE SODIUM SUCC 125 MG IJ SOLR
125.0000 mg | Freq: Once | INTRAMUSCULAR | Status: AC
  Administered 2020-03-08: 09:00:00 125 mg via INTRAMUSCULAR

## 2020-03-08 NOTE — ED Triage Notes (Signed)
Pt c/o rash on bilateral arms, legs and chest. Started about 4 days ago. Rash is red, raised and itchy. Pt denies any new medications, soaps or detergents.

## 2020-03-08 NOTE — Discharge Instructions (Addendum)
Take the prednisone Dosepak as prescribed.  Continue with over-the-counter medications including hydrocortisone, Benadryl cream, oatmeal, and calamine lotion.  Tylenol or Motrin for fever discomfort.  Should you have any worsening rash, open wounds or active infection, or you feel your throat closing or tightening then you should seek out immediate medical attention.  Return here or see your PCP if symptoms were to worsen or not improve.

## 2020-03-08 NOTE — ED Provider Notes (Signed)
[facility]    CSN: [id number] Arrival date & time: 03/08/20  0807      History   Chief Complaint Chief Complaint  Patient presents with  . Rash    HPI Debra Alexander is a 69 y.o. female.   Patient is a pleasant 69 year old female who presents for evaluation of a generalized rash.  She noticed it on Friday, 17 December.  She does not recall any exposure.  It started on her arms but it spread over her legs her abdomen and into her groin.  It is quite pruritic.  No open wounds or active infection noted by the patient.  Patient does have some baseline eczema and has been using hydrocortisone cream with limited success.  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  No fever shakes chills nausea vomiting or diarrhea noted.  No chest pain shortness of breath.  She does not feel that her throat is tightening.  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  She denies any new detergents, soaps, perfumes or body sprays. Patient lives with her husband and he does not have similar symptoms.  No other issues or problems are offered.     Past Medical History:  Diagnosis Date  . Hyperlipidemia   . Hypertension   . Osteoporosis     There are no problems to display for this patient.   Past Surgical History:  Procedure Laterality Date  . ABDOMINAL HYSTERECTOMY    . BREAST CYST ASPIRATION Left 2007   neg    OB History   No obstetric history on file.      Home Medications    Prior to Admission medications   Medication Sig Start Date End Date Taking? Authorizing Provider  Calcium Carb-Cholecalciferol 600-400 MG-UNIT CAPS Take by mouth. 10/15/12  Yes [provider]  fluticasone (FLONASE) 50 MCG/ACT nasal spray Place into both nostrils daily.   Yes [provider]  ipratropium (ATROVENT) 0.06 % nasal spray Place 2 sprays into both nostrils 4 (four) times daily as needed for  rhinitis. 08/25/19  Yes Cook, Jayce G, DO  lisinopril (PRINIVIL,ZESTRIL) 20 MG tablet Take 20 mg by mouth daily.   Yes [provider]  lovastatin (MEVACOR) 40 MG tablet Take 40 mg by mouth at bedtime.   Yes [provider]  metoprolol succinate (TOPROL-XL) 25 MG 24 hr tablet TAKE 1 TABLET BY MOUTH EVERY DAY 09/29/10  Yes [provider]  Multiple Vitamin (MULTIVITAMIN) tablet Take 1 tablet by mouth daily.   Yes [provider]  triamcinolone cream (KENALOG) 0.1 % Apply thin film topically 2 times daily as needed to eczema like skin rash 04/18/19  Yes [provider]  alendronate (FOSAMAX) 70 MG tablet Take 70 mg by mouth once a week. 12/30/19   [provider]  predniSONE (DELTASONE) 10 MG tablet 50 mg daily x 3 days, then 40 mg daily x 3 days, then 30 mg daily x 3 days, then 20 mg daily x 3 days, then 10 mg daily x 3 days. 11/12/19   Tommie Sams, DO    Family History Family History  Problem Relation Age of Onset  . Hypertension Mother   . CAD Mother   . CAD Father   . Breast cancer Neg Hx     Social History Social History   Tobacco Use  . Smoking status: Former Smoker  Packs/day: 1.00    Years: 25.00    Pack years: 25.00    Types: Cigarettes    Quit date: 11/11/1989    Years since quitting: 30.3  . Smokeless tobacco: Never Used  Vaping Use  . Vaping Use: Never used  Substance Use Topics  . Alcohol use: Yes    Comment: social  . Drug use: No     Allergies   Azithromycin, Atorvastatin, Sulfa antibiotics, Zolpidem, and Amoxicillin   Review of Systems Review of Systems  Constitutional: Negative for chills, fatigue and fever.  HENT: Negative for sore throat.   Respiratory: Negative for cough, chest tightness, shortness of breath, wheezing and stridor.   Cardiovascular: Negative for chest pain.  Gastrointestinal: Negative for abdominal pain.  Skin: Positive for rash. Negative for color change, pallor and wound.   Allergic/Immunologic: Negative for environmental allergies and food allergies.  Neurological: Negative for weakness and headaches.  All other systems reviewed and are negative.    Physical Exam Triage Vital Signs ED Triage Vitals  Enc Vitals Group     BP 03/08/20 0827 (!) 190/103     Pulse Rate 03/08/20 0827 88     Resp 03/08/20 0827 18     Temp 03/08/20 0827 98.5 F (36.9 C)     Temp Source 03/08/20 0827 Oral     SpO2 03/08/20 0827 100 %     Weight 03/08/20 0825 149 lb 14.6 oz (68 kg)     Height 03/08/20 0825 5\' 4"  (1.626 m)     Head Circumference --      Peak Flow --      Pain Score 03/08/20 0825 0     Pain Loc --      Pain Edu? --      Excl. in GC? --    No data found.  Updated Vital Signs BP (!) 190/103 (BP Location: Left Arm)   Pulse 88   Temp 98.5 F (36.9 C) (Oral)   Resp 18   Ht 5\' 4"  (1.626 m)   Wt 68 kg   SpO2 100%   BMI 25.73 kg/m   Visual Acuity Right Eye Distance:   Left Eye Distance:   Bilateral Distance:    Right Eye Near:   Left Eye Near:    Bilateral Near:     Physical Exam  General: Alert, pleasant, no acute distress.  Non-ill appearing.  Nontoxic-appearing.  Note no acute distress. HEENT normocephalic atraumatic. HEENT pupils are equal reactive to light, conjunctiva noninjected nonicteric.  Extraocular muscles are intact. Neck is supple full range of motion. Heart regular rate and rhythm no murmurs gallops or rubs appreciated. Lungs clear to auscultation bilaterally without wheeze rhonchi or rubs. Skin exam warm dry.  There is a global rash noted.  It is raised in certain areas with some hive-like appearance.  There is some excoriations due to scratching.  No open wounds or active infection.  The rash is nonvesicular and not consistent with any poison ivy or post poison oak.  Neurologically: Alert and oriented x4 with no focal deficit. UC Treatments / Results  Labs (all labs ordered are listed, but only abnormal results are  displayed) Labs Reviewed - No data to display  EKG   Radiology No results found.  Procedures Procedures (including critical care time)  Medications Ordered in UC Medications  methylPREDNISolone sodium succinate (SOLU-MEDROL) 125 mg/2 mL injection 125 mg (125 mg Intramuscular Given 03/08/20 0842)    Initial Impression / Assessment and Plan / UC  Course  I have reviewed the triage vital signs and the nursing notes.  Pertinent labs & imaging results that were available during my care of the patient were reviewed by me and considered in my medical decision making (see chart for details).     Clinical impression: Global icteric rash consistent with allergic reaction of unknown source.  Plan:  1. The findings treat plan discussed in detail with the patient, patient was in agreement and voiced verbal understanding 2.  Will give her Solu-Medrol 125 mg IM here in the office. 3.  Will prescribe a prednisone Dosepak.   4 continue with over-the-counter medications including hydrocortisone, Benadryl cream, oatmeal, and calamine lotion.  Tylenol Motrin for fever discomfort. 5.  Advised of the red flag signs and symptoms and when to seek out immediate medical attention. 6.  Patient to return to clinic, PCP, or seek out higher level of care if she was to worsen or not improve.  I did discuss anaphylaxis as well as throat tightening with her.  She voiced verbal understanding. Final Clinical Impressions(s) / UC Diagnoses   Final diagnoses:  Rash  Allergic reaction, initial encounter     Discharge Instructions     Take the prednisone Dosepak as prescribed.  Continue with over-the-counter medications including hydrocortisone, Benadryl cream, oatmeal, and calamine lotion.  Tylenol or Motrin for fever discomfort.  Should you have any worsening rash, open wounds or active infection, or you feel your throat closing or tightening then you should seek out immediate medical attention.  Return here or  see your PCP if symptoms were to worsen or not improve.    ED Prescriptions    None     PDMP not reviewed this encounter.   Delton See, MD 03/08/20 [phone]

## 2020-07-13 ENCOUNTER — Other Ambulatory Visit: Payer: Self-pay | Admitting: Gerontology

## 2020-07-13 DIAGNOSIS — Z1231 Encounter for screening mammogram for malignant neoplasm of breast: Secondary | ICD-10-CM

## 2020-08-24 ENCOUNTER — Encounter: Payer: Self-pay | Admitting: Emergency Medicine

## 2020-08-24 ENCOUNTER — Ambulatory Visit
Admission: EM | Admit: 2020-08-24 | Discharge: 2020-08-24 | Disposition: A | Discharge status: Home / Self Care | Payer: Medicare Other | Attending: Physician Assistant | Admitting: Physician Assistant

## 2020-08-24 ENCOUNTER — Other Ambulatory Visit: Payer: Self-pay

## 2020-08-24 DIAGNOSIS — J019 Acute sinusitis, unspecified: Secondary | ICD-10-CM | POA: Diagnosis not present

## 2020-08-24 DIAGNOSIS — R059 Cough, unspecified: Secondary | ICD-10-CM

## 2020-08-24 DIAGNOSIS — R0981 Nasal congestion: Secondary | ICD-10-CM

## 2020-08-24 DIAGNOSIS — I10 Essential (primary) hypertension: Secondary | ICD-10-CM

## 2020-08-24 DIAGNOSIS — J309 Allergic rhinitis, unspecified: Secondary | ICD-10-CM

## 2020-08-24 MED ORDER — DOXYCYCLINE HYCLATE 100 MG PO CAPS: 100.0000 mg | ORAL_CAPSULE | Freq: Two times a day (BID) | ORAL | 0 refills | Status: AC

## 2020-08-24 MED ORDER — BENZONATATE 200 MG PO CAPS: 200.0000 mg | ORAL_CAPSULE | Freq: Three times a day (TID) | ORAL | 0 refills | Status: AC | PRN

## 2020-08-24 NOTE — Discharge Instructions (Signed)
As we discussed your symptoms are most likely due to your allergies.  Continue taking Zyrtec and using Flonase.  I have sent a cough medication for you.  If you are can take any cough medication over-the-counter you should take Coricidin HBP.  This will not raise your blood pressure.  Your blood pressure is significantly elevated today.  Make sure you take your blood pressure as you are supposed to and keep a log of your blood pressure to follow-up with your PCP about.  Your blood pressure should be below 130/80.  Decrease dietary sodium and get plenty of exercise.  I have sent an antibiotic for you in case this is a secondary bacterial sinus infection.  If you are still having symptoms after you take this or you start to feel worse she should follow-up with your primary care provider.  Return to our office or go to ER for any acute worsening of symptoms.

## 2020-08-24 NOTE — ED Provider Notes (Signed)
[facility]    CSN: [id number] Arrival date & time: 08/24/20  0816      History   Chief Complaint Chief Complaint  Patient presents with  . Cough  . Nasal Congestion    HPI Debra Alexander is a 70 y.o. female presenting for approximately 1 month history of cough and yellowish-[place] sputum production and nasal congestion.  She also notices some headaches and sinus pressure.  Patient says her symptoms have not gotten better or worse over this period of time.  She does have history of allergies and takes daily Zyrtec and Flonase.  Patient says she occasionally has been using over-the-counter Mucinex and decongestants as well.  She says that her family members urged her to get checked out today since her symptoms have been persistent.  She denies any fevers.  Has been mildly fatigued.  No ear pain, sore throat, chest discomfort, breathing difficulty, nausea/vomiting.  Patient denies any sick contacts and no known exposure to COVID-19.  Vaccinated for COVID-19 x3.  Other past medical history significant for hypertension and hyperlipidemia.  Patient's blood pressure is elevated in the clinic today at 197/100.  Patient says she did not take her blood pressure medication today.  She has no other complaints.  HPI  Past Medical History:  Diagnosis Date  . Hyperlipidemia   . Hypertension   . Osteoporosis     There are no problems to display for this patient.   Past Surgical History:  Procedure Laterality Date  . ABDOMINAL HYSTERECTOMY    . BREAST CYST ASPIRATION Left 2007   neg    OB History   No obstetric history on file.      Home Medications    Prior to Admission medications   Medication Sig Start Date End Date Taking? Authorizing Provider  alendronate (FOSAMAX) 70 MG tablet Take 70 mg by mouth once a week. 12/30/19  Yes [provider]  benzonatate (TESSALON) 200 MG capsule Take 1 capsule (200 mg total) by mouth 3 (three) times daily as needed for up to  10 days for cough. 08/24/20 09/03/20 Yes Shirlee Latch, PA-C  Calcium Carb-Cholecalciferol 600-400 MG-UNIT CAPS Take by mouth. 10/15/12  Yes [provider]  doxycycline (VIBRAMYCIN) 100 MG capsule Take 1 capsule (100 mg total) by mouth 2 (two) times daily for 7 days. 08/24/20 08/31/20 Yes Eusebio Friendly B, PA-C  fluticasone (FLONASE) 50 MCG/ACT nasal spray Place into both nostrils daily.   Yes [provider]  lisinopril (PRINIVIL,ZESTRIL) 20 MG tablet Take 20 mg by mouth daily.   Yes [provider]  lovastatin (MEVACOR) 40 MG tablet Take 40 mg by mouth at bedtime.   Yes [provider]  metoprolol succinate (TOPROL-XL) 25 MG 24 hr tablet TAKE 1 TABLET BY MOUTH EVERY DAY 09/29/10  Yes [provider]  Multiple Vitamin (MULTIVITAMIN) tablet Take 1 tablet by mouth daily.   Yes [provider]  triamcinolone cream (KENALOG) 0.1 % Apply thin film topically 2 times daily as needed to eczema like skin rash 04/18/19   [provider]  ipratropium (ATROVENT) 0.06 % nasal spray Place 2 sprays into both nostrils 4 (four) times daily as needed for rhinitis. 08/25/19 08/24/20  Tommie Sams, DO    Family History Family History  Problem Relation Age of Onset  . Hypertension Mother   . CAD Mother   . CAD Father   . Breast cancer Neg Hx     Social History Social History  Tobacco Use  . Smoking status: Former Smoker    Packs/day: 1.00    Years: 25.00    Pack years: 25.00    Types: Cigarettes    Quit date: 11/11/1989    Years since quitting: 30.8  . Smokeless tobacco: Never Used  Vaping Use  . Vaping Use: Never used  Substance Use Topics  . Alcohol use: Yes    Comment: social  . Drug use: No     Allergies   Azithromycin, Atorvastatin, Sulfa antibiotics, Zolpidem, and Amoxicillin   Review of Systems Review of Systems  Constitutional: Positive for fatigue. Negative for chills, diaphoresis and fever.  HENT: Positive for congestion,  rhinorrhea, sinus pressure and sinus pain. Negative for ear pain and sore throat.   Respiratory: Positive for cough. Negative for shortness of breath.   Cardiovascular: Negative for chest pain.  Gastrointestinal: Negative for abdominal pain, nausea and vomiting.  Musculoskeletal: Negative for arthralgias and myalgias.  Skin: Negative for rash.  Neurological: Positive for headaches. Negative for weakness.  Hematological: Negative for adenopathy.     Physical Exam Triage Vital Signs ED Triage Vitals [08/24/20 0831]  Enc Vitals Group     BP (!) 197/100     Pulse Rate 88     Resp 18     Temp 98.5 F (36.9 C)     Temp Source Oral     SpO2 100 %     Weight 149 lb 14.6 oz (68 kg)     Height 5\' 4"  (1.626 m)     Head Circumference      Peak Flow      Pain Score 0     Pain Loc      Pain Edu?      Excl. in GC?    No data found.  Updated Vital Signs BP (!) 181/102 (BP Location: Left Arm)   Pulse 88   Temp 98.5 F (36.9 C) (Oral)   Resp 18   Ht 5\' 4"  (1.626 m)   Wt 149 lb 14.6 oz (68 kg)   SpO2 100%   BMI 25.73 kg/m       Physical Exam Vitals and nursing note reviewed.  Constitutional:      General: She is not in acute distress.    Appearance: Normal appearance. She is not ill-appearing or toxic-appearing.  HENT:     Head: Normocephalic and atraumatic.     Right Ear: Tympanic membrane, ear canal and external ear normal.     Left Ear: Tympanic membrane, ear canal and external ear normal.     Nose: Congestion present.     Mouth/Throat:     Mouth: Mucous membranes are moist.     Pharynx: Oropharynx is clear.  Eyes:     General: No scleral icterus.       Right eye: No discharge.        Left eye: No discharge.     Conjunctiva/sclera: Conjunctivae normal.  Cardiovascular:     Rate and Rhythm: Normal rate and regular rhythm.     Heart sounds: Normal heart sounds.  Pulmonary:     Effort: Pulmonary effort is normal. No respiratory distress.     Breath sounds: Normal  breath sounds.  Musculoskeletal:     Cervical back: Neck supple.  Skin:    General: Skin is dry.  Neurological:     General: No focal deficit present.     Mental Status: She is alert. Mental status is at baseline.  Motor: No weakness.     Gait: Gait normal.  Psychiatric:        Mood and Affect: Mood normal.        Behavior: Behavior normal.        Thought Content: Thought content normal.      UC Treatments / Results  Labs (all labs ordered are listed, but only abnormal results are displayed) Labs Reviewed - No data to display  EKG   Radiology No results found.  Procedures Procedures (including critical care time)  Medications Ordered in UC Medications - No data to display  Initial Impression / Assessment and Plan / UC Course  I have reviewed the triage vital signs and the nursing notes.  Pertinent labs & imaging results that were available during my care of the patient were reviewed by me and considered in my medical decision making (see chart for details).   70 year old female presenting with 1 month history of cough, congestion and facial pain/pressure.  Patient has history of allergies.  Currently her Zyrtec and Flonase as well as Mucinex do not help symptoms.  Exam reveals nasal mucosal edema and mild sinus tenderness.  Remainder the exam is within normal limits.  Her chest is clear to auscultation and heart regular rate and rhythm.  Recheck of blood pressure is 181/102.  Suspect that her symptoms are most likely due to her allergies.  Advised her that since the symptoms have been ongoing and not really responding to allergy medicine we can try a trial of antibiotic.  I sent in a week supply of doxycycline.  Encouraged her to continue Zyrtec and Flonase and try the benzonatate for cough.  Advised that she is can take any OTC decongestants to try Coricidin HBP since it will not raise blood pressure.  Advised to keep a log of blood pressure and follow-up with primary  care provider since it is uncontrolled.  She did have an appointment last month with her PCP.  Advised to going to the ED for any severe acute worsening of her blood pressure if she has any accompanying symptoms of chest pain or stroke symptoms.  Final Clinical Impressions(s) / UC Diagnoses   Final diagnoses:  Acute sinusitis, recurrence not specified, unspecified location  Nasal congestion  Cough  Allergic rhinitis, unspecified seasonality, unspecified trigger  Uncontrolled hypertension     Discharge Instructions     As we discussed your symptoms are most likely due to your allergies.  Continue taking Zyrtec and using Flonase.  I have sent a cough medication for you.  If you are can take any cough medication over-the-counter you should take Coricidin HBP.  This will not raise your blood pressure.  Your blood pressure is significantly elevated today.  Make sure you take your blood pressure as you are supposed to and keep a log of your blood pressure to follow-up with your PCP about.  Your blood pressure should be below 130/80.  Decrease dietary sodium and get plenty of exercise.  I have sent an antibiotic for you in case this is a secondary bacterial sinus infection.  If you are still having symptoms after you take this or you start to feel worse she should follow-up with your primary care provider.  Return to our office or go to ER for any acute worsening of symptoms.    ED Prescriptions    Medication Sig Dispense Auth. Provider   doxycycline (VIBRAMYCIN) 100 MG capsule Take 1 capsule (100 mg total) by mouth 2 (two) times  daily for 7 days. 14 capsule Eusebio Friendly B, PA-C   benzonatate (TESSALON) 200 MG capsule Take 1 capsule (200 mg total) by mouth 3 (three) times daily as needed for up to 10 days for cough. 20 capsule Shirlee Latch, PA-C     PDMP not reviewed this encounter.   Shirlee Latch, PA-C 08/24/20 [phone]

## 2020-08-24 NOTE — ED Triage Notes (Signed)
Pt c/o cough, nasal congestion, and sputum production. Started about a month ago. Denies fever.

## 2020-10-01 ENCOUNTER — Other Ambulatory Visit: Payer: Self-pay

## 2020-10-01 ENCOUNTER — Ambulatory Visit
Admission: EM | Admit: 2020-10-01 | Discharge: 2020-10-01 | Disposition: A | Discharge status: Home / Self Care | Payer: Medicare Other

## 2020-10-01 ENCOUNTER — Ambulatory Visit (INDEPENDENT_AMBULATORY_CARE_PROVIDER_SITE_OTHER): Payer: Medicare Other

## 2020-10-01 DIAGNOSIS — R079 Chest pain, unspecified: Secondary | ICD-10-CM | POA: Diagnosis not present

## 2020-10-01 DIAGNOSIS — S29011A Strain of muscle and tendon of front wall of thorax, initial encounter: Secondary | ICD-10-CM | POA: Diagnosis not present

## 2020-10-01 DIAGNOSIS — R059 Cough, unspecified: Secondary | ICD-10-CM

## 2020-10-01 MED ORDER — BENZONATATE 100 MG PO CAPS: 200.0000 mg | ORAL_CAPSULE | Freq: Three times a day (TID) | ORAL | 0 refills | Status: DC

## 2020-10-01 NOTE — Discharge Instructions (Addendum)
Your chest x-ray today did not show the presence of pneumonia or broken ribs.  Your symptoms are most consistent with a strain of your chest wall that is probably from your coughing.  I am going to prescribe Tessalon Perles to help you with your cough.  You can take 1 tablet every 8 hours with a small sip of water.  You may experience some numbness to the base of your tongue or metallic taste in her mouth, this is normal.  For the pain in your chest wall apply topical Voltaren gel 2 inches every 6 hours as needed for pain.  This will control the inflammation.  You can also apply a topical lidocaine patch, which is available over-the-counter, every 8 hours to help with the pain.  Moist heat may also be helpful in helping you with your pain.  The easiest way to do moist heat is to take a washrag and run under hot tap water, wring it out, and apply it to your chest wall and put it heating pad on top of.  Leave it there for 30 minutes.  Return for reevaluation, or see your primary care provider, for new or worsening symptoms.

## 2020-10-01 NOTE — ED Provider Notes (Addendum)
[facility]    CSN: [id number] Arrival date & time: 10/01/20  [id number]      History   Chief Complaint Chief Complaint  Patient presents with   Chest Pain    Ribs/from coughing    HPI Debra Alexander is a 70 y.o. female.   HPI  70 year old female here for evaluation of right rib pain.  Patient reports that she has been experiencing nasal congestion and a productive cough for clear mucus for several days.  She was evaluated by her primary care doctor on 09/29/2020 and was given oral prednisone, Levaquin, and is Astelin nasal spray.  She is taking all this currently and reports that it has helped but she is continue to have this rib pain on the right-hand side.  The rib pain is worse with cough, deep breathing, or movement.  She states that she had shortness of breath when she saw her doctor but has not since and she denies fever or hemoptysis.  Past Medical History:  Diagnosis Date   Hyperlipidemia    Hypertension    Osteoporosis     There are no problems to display for this patient.   Past Surgical History:  Procedure Laterality Date   ABDOMINAL HYSTERECTOMY     BREAST CYST ASPIRATION Left 2007   neg    OB History   No obstetric history on file.      Home Medications    Prior to Admission medications   Medication Sig Start Date End Date Taking? Authorizing Provider  alendronate (FOSAMAX) 70 MG tablet Take 70 mg by mouth once a week. 12/30/19  Yes [provider]  azelastine (ASTELIN) 0.1 % nasal spray Place into the nose. 09/29/20  Yes [provider]  buPROPion (WELLBUTRIN XL) 150 MG 24 hr tablet Take 150 mg by mouth daily. 07/13/20  Yes [provider]  Calcium Carb-Cholecalciferol 600-400 MG-UNIT CAPS Take by mouth. 10/15/12  Yes [provider]  fluticasone (FLONASE) 50 MCG/ACT nasal spray Place into both nostrils daily.   Yes [provider]  levofloxacin (LEVAQUIN) 250 MG tablet Take by mouth. 09/29/20  10/06/20 Yes [provider]  lisinopril-hydrochlorothiazide (ZESTORETIC) 20-12.5 MG tablet Take 2 tablets by mouth daily. 07/13/20  Yes [provider]  lovastatin (MEVACOR) 40 MG tablet Take 40 mg by mouth at bedtime.   Yes [provider]  metoprolol succinate (TOPROL-XL) 25 MG 24 hr tablet TAKE 1 TABLET BY MOUTH EVERY DAY 09/29/10  Yes [provider]  Multiple Vitamin (MULTIVITAMIN) tablet Take 1 tablet by mouth daily.   Yes [provider]  predniSONE (DELTASONE) 20 MG tablet Take by mouth. 09/29/20 10/04/20 Yes [provider]  triamcinolone cream (KENALOG) 0.1 % Apply thin film topically 2 times daily as needed to eczema like skin rash 04/18/19  Yes [provider]  ipratropium (ATROVENT) 0.06 % nasal spray Place 2 sprays into both nostrils 4 (four) times daily as needed for rhinitis. 08/25/19 08/24/20  Tommie Samsook, Jayce G, DO    Family History Family History  Problem Relation Age of Onset   Hypertension Mother    CAD Mother    CAD Father    Breast cancer Neg Hx     Social History Social History   Tobacco Use   Smoking status: Former    Packs/day: 1.00    Years: 25.00    Pack years: 25.00    Types: Cigarettes    Quit date: 11/11/1989    Years since quitting: 30.9  Smokeless tobacco: Never  Vaping Use   Vaping Use: Never used  Substance Use Topics   Alcohol use: Yes    Comment: social   Drug use: No     Allergies   Azithromycin, Atorvastatin, Sulfa antibiotics, Zolpidem, and Amoxicillin   Review of Systems Review of Systems  Constitutional:  Negative for fever.  Respiratory:  Positive for cough and shortness of breath.   Cardiovascular:  Positive for chest pain.    Physical Exam Triage Vital Signs ED Triage Vitals  Enc Vitals Group     BP 10/01/20 0838 (!) 180/106     Pulse Rate 10/01/20 0838 89     Resp 10/01/20 0838 18     Temp 10/01/20 0838 98.4 F (36.9 C)     Temp Source 10/01/20 0838 Oral     SpO2  10/01/20 0838 100 %     Weight 10/01/20 0835 157 lb (71.2 kg)     Height 10/01/20 0835 5\' 4"  (1.626 m)     Head Circumference --      Peak Flow --      Pain Score 10/01/20 0835 6     Pain Loc --      Pain Edu? --      Excl. in GC? --    No data found.  Updated Vital Signs BP (!) 180/106 (BP Location: Left Arm)   Pulse 89   Temp 98.4 F (36.9 C) (Oral)   Resp 18   Ht 5\' 4"  (1.626 m)   Wt 157 lb (71.2 kg)   SpO2 100%   BMI 26.95 kg/m   Visual Acuity Right Eye Distance:   Left Eye Distance:   Bilateral Distance:    Right Eye Near:   Left Eye Near:    Bilateral Near:     Physical Exam Vitals and nursing note reviewed.  Constitutional:      General: She is not in acute distress.    Appearance: She is well-developed and normal weight. She is not ill-appearing.  HENT:     Head: Normocephalic and atraumatic.  Eyes:     General:        Right eye: No discharge.        Left eye: No discharge.     Extraocular Movements: Extraocular movements intact.     Pupils: Pupils are equal, round, and reactive to light.  Cardiovascular:     Rate and Rhythm: Normal rate and regular rhythm.     Pulses: Normal pulses.     Heart sounds: Normal heart sounds. No murmur heard.   No gallop.  Pulmonary:     Effort: Pulmonary effort is normal.     Breath sounds: Normal breath sounds. No wheezing, rhonchi or rales.  Chest:     Chest wall: Tenderness present.  Musculoskeletal:        General: Tenderness present.  Skin:    General: Skin is warm and dry.     Capillary Refill: Capillary refill takes less than 2 seconds.     Findings: No erythema or rash.  Neurological:     General: No focal deficit present.     Mental Status: She is alert and oriented to person, place, and time.  Psychiatric:        Mood and Affect: Mood normal.        Behavior: Behavior normal.        Thought Content: Thought content normal.        Judgment: Judgment normal.  UC Treatments / Results  Labs (all  labs ordered are listed, but only abnormal results are displayed) Labs Reviewed - No data to display  EKG   Radiology DG Ribs Unilateral W/Chest Right  Result Date: 10/01/2020 CLINICAL DATA:  Chest wall pain and cough. EXAM: RIGHT RIBS AND CHEST - 3+ VIEW COMPARISON:  None. FINDINGS: Normal heart size. No pleural effusion or edema. No airspace densities identified. No rib fractures identified. IMPRESSION: Negative. Electronically Signed   By: Signa Kell M.D.   On: 10/01/2020 09:32    Procedures Procedures (including critical care time)  Medications Ordered in UC Medications - No data to display  Initial Impression / Assessment and Plan / UC Course  I have reviewed the triage vital signs and the nursing notes.  Pertinent labs & imaging results that were available during my care of the patient were reviewed by me and considered in my medical decision making (see chart for details).  Patient is a very pleasant 70 year old female here for evaluation of right rib pain that she is attributing to her cough as outlined in the HPI above.  Patient's physical exam reveals a patient who is in mild discomfort but no acute distress who can speak in full sentences and is not having dyspnea.  Her cardiopulmonary exam reveals S1-S2 heart sounds without murmur, rub, or gallop.  Lung sounds are clear to auscultation all fields.  Patient does have tenderness to palpation of the chest wall on the right-hand side just below her right breast.  There is no crepitus appreciated on exam and patient does not withdraw to pain.  Suspect patient has strained her right chest wall from coughing but will obtain right rib films and chest to evaluate for possible rib fracture.  Right rib and chest x-rays independently reviewed and evaluated by me.  Interpretation: No evidence of rib fracture noted.  No infiltrate present.  Awaiting radiology overread. Radiology interpretation is that the chest x-ray and rib films are  negative.  Will discharge patient home with a diagnosis of right chest wall strain and have her use topical lidocaine patches, topical Voltaren gel, and moist heat.  I will also prescribe Tessalon Perles to help with cough symptoms.   Final Clinical Impressions(s) / UC Diagnoses   Final diagnoses:  Chest wall muscle strain, initial encounter     Discharge Instructions      Your chest x-ray today did not show the presence of pneumonia or broken ribs.  Your symptoms are most consistent with a strain of your chest wall that is probably from your coughing.  I am going to prescribe Tessalon Perles to help you with your cough.  You can take 1 tablet every 8 hours with a small sip of water.  You may experience some numbness to the base of your tongue or metallic taste in her mouth, this is normal.  For the pain in your chest wall apply topical Voltaren gel 2 inches every 6 hours as needed for pain.  This will control the inflammation.  You can also apply a topical lidocaine patch, which is available over-the-counter, every 8 hours to help with the pain.  Moist heat may also be helpful in helping you with your pain.  The easiest way to do moist heat is to take a washrag and run under hot tap water, wring it out, and apply it to your chest wall and put it heating pad on top of.  Leave it there for 30 minutes.  Return for  reevaluation, or see your primary care provider, for new or worsening symptoms.     ED Prescriptions   None    PDMP not reviewed this encounter.   Becky Augusta, NP 10/01/20 [id number]    Becky Augusta, NP 10/01/20 [phone]

## 2020-10-01 NOTE — ED Triage Notes (Signed)
Pt c/o continued nasal congestion and cough. Pt was seen by PCP 09/29/20 and given oral pred, abx and nasal spray. Pt is taking all of these currently. Pt reports new onset of rib pain with coughing, she is concerned about pna. Pt denies f/n/v/d or other symptoms.

## 2020-10-12 ENCOUNTER — Ambulatory Visit
Admit: 2020-10-12 | Discharge: 2020-10-13 | Disposition: A | Discharge status: Home / Self Care | Payer: MEDICARE | Attending: Emergency Medicine

## 2020-10-13 MED ORDER — PREDNISOLONE ACETATE 1 % EYE DROPS,SUSPENSION
Freq: Three times a day (TID) | TOPICAL | 0 refills | 7 days | Status: CP
Start: 2020-10-13 — End: 2020-10-18

## 2020-10-13 MED ORDER — HYDROCHLOROTHIAZIDE 25 MG TABLET
ORAL_TABLET | Freq: Every day | ORAL | 0 refills | 20 days | Status: CP
Start: 2020-10-13 — End: 2020-11-02

## 2020-10-13 MED ORDER — LOSARTAN 50 MG TABLET
ORAL_TABLET | Freq: Every day | ORAL | 0 refills | 20 days | Status: CP
Start: 2020-10-13 — End: 2020-11-02

## 2020-10-13 MED ORDER — AMOXICILLIN 875 MG-POTASSIUM CLAVULANATE 125 MG TABLET
ORAL_TABLET | Freq: Two times a day (BID) | ORAL | 0 refills | 7 days | Status: CP
Start: 2020-10-13 — End: 2020-10-20

## 2020-10-21 ENCOUNTER — Non-Acute Institutional Stay: Admit: 2020-10-21 | Discharge: 2020-10-24 | Payer: MEDICARE

## 2020-10-21 ENCOUNTER — Ambulatory Visit: Admit: 2020-10-21 | Discharge: 2020-10-24 | Payer: MEDICARE

## 2020-10-21 ENCOUNTER — Encounter: Admit: 2020-10-21 | Discharge: 2020-10-24 | Payer: MEDICARE

## 2020-10-24 MED ORDER — FLUTICASONE PROPIONATE 50 MCG/ACTUATION NASAL SPRAY,SUSPENSION
Freq: Every day | NASAL | 0 refills | 0 days | Status: CP
Start: 2020-10-24 — End: ?

## 2020-10-24 MED ORDER — SODIUM CHLORIDE 0.65 % NASAL SPRAY AEROSOL
Freq: Four times a day (QID) | NASAL | 0 refills | 10 days | Status: CP | PRN
Start: 2020-10-24 — End: 2021-10-24

## 2020-10-24 MED ORDER — PREDNISOLONE ACETATE 1 % EYE DROPS,SUSPENSION
Freq: Three times a day (TID) | TOPICAL | 0 refills | 7 days | Status: CP
Start: 2020-10-24 — End: 2020-10-29

## 2020-10-24 MED ORDER — FLUTICASONE FUROATE 100 MCG-VILANTEROL 25 MCG/DOSE INHALATION POWDER
Freq: Every day | RESPIRATORY_TRACT | 0 refills | 1 days | Status: CP
Start: 2020-10-24 — End: ?

## 2020-10-24 MED ORDER — ALBUTEROL SULFATE HFA 90 MCG/ACTUATION AEROSOL INHALER
Freq: Four times a day (QID) | RESPIRATORY_TRACT | 0 refills | 0 days | Status: CP | PRN
Start: 2020-10-24 — End: 2021-10-24

## 2020-11-16 ENCOUNTER — Ambulatory Visit: Admit: 2020-11-16 | Discharge: 2020-11-17 | Payer: MEDICARE

## 2020-11-16 DIAGNOSIS — J309 Allergic rhinitis, unspecified: Principal | ICD-10-CM

## 2020-11-16 DIAGNOSIS — J329 Chronic sinusitis, unspecified: Principal | ICD-10-CM

## 2020-11-16 MED ORDER — FLUTICASONE PROPIONATE 50 MCG/ACTUATION NASAL SPRAY,SUSPENSION
Freq: Two times a day (BID) | NASAL | 11 refills | 0 days | Status: CP
Start: 2020-11-16 — End: ?

## 2020-11-20 ENCOUNTER — Other Ambulatory Visit: Payer: Self-pay

## 2020-11-20 ENCOUNTER — Encounter: Payer: Self-pay | Admitting: Emergency Medicine

## 2020-11-20 ENCOUNTER — Ambulatory Visit
Admission: EM | Admit: 2020-11-20 | Discharge: 2020-11-20 | Disposition: A | Discharge status: Home / Self Care | Payer: Medicare Other

## 2020-11-20 DIAGNOSIS — R21 Rash and other nonspecific skin eruption: Secondary | ICD-10-CM | POA: Diagnosis not present

## 2020-11-20 MED ORDER — PREDNISONE 10 MG PO TABS: ORAL_TABLET | ORAL | 0 refills | Status: DC

## 2020-11-20 NOTE — ED Provider Notes (Signed)
[facility]    CSN: [id number] Arrival date & time: 11/20/20  1023      History   Chief Complaint Chief Complaint  Patient presents with   Rash    HPI Debra Alexander is a 70 y.o. female presenting for red and itchy rash over the past week.  Patient states she has had this same rash on her arms multiple times in the past year.  She has just gone to urgent care and has not seen a dermatologist about it yet.  She has applied triamcinolone 0 point percent cream as prescribed at a previous visit but says it has not helped.  She also takes daily antihistamines for her allergies.  No known history of eczema or other skin conditions.  Patient denies any oral swelling, chest tightness or breathing difficulty.  She does have history of hypertension and sees a cardiologist regarding this.  Her medication has recently changed from lisinopril.  She is taking Losartan and Metoprolol. Last office visit with [facility] was 11/08/20 and BP was 134/86. No other complaints.   HPI  Past Medical History:  Diagnosis Date   Hyperlipidemia    Hypertension    Osteoporosis     There are no problems to display for this patient.   Past Surgical History:  Procedure Laterality Date   ABDOMINAL HYSTERECTOMY     BREAST CYST ASPIRATION Left 2007   neg    OB History   No obstetric history on file.      Home Medications    Prior to Admission medications   Medication Sig Start Date End Date Taking? Authorizing Provider  albuterol (VENTOLIN HFA) 108 (90 Base) MCG/ACT inhaler Inhale into the lungs. 10/24/20 10/24/21 Yes [provider]  alendronate (FOSAMAX) 70 MG tablet Take 70 mg by mouth once a week. 12/30/19  Yes [provider]  buPROPion (WELLBUTRIN XL) 150 MG 24 hr tablet Take 150 mg by mouth daily. 07/13/20  Yes [provider]  Calcium Carb-Cholecalciferol 600-400 MG-UNIT CAPS Take by mouth. 10/15/12  Yes [provider]  fluticasone (FLONASE) 50  MCG/ACT nasal spray Place into both nostrils daily.   Yes [provider]  lisinopril-hydrochlorothiazide (ZESTORETIC) 20-12.5 MG tablet Take 2 tablets by mouth daily. 07/13/20  Yes [provider]  lovastatin (MEVACOR) 40 MG tablet Take 40 mg by mouth at bedtime.   Yes [provider]  metoprolol succinate (TOPROL-XL) 25 MG 24 hr tablet TAKE 1 TABLET BY MOUTH EVERY DAY 09/29/10  Yes [provider]  Multiple Vitamin (MULTIVITAMIN) tablet Take 1 tablet by mouth daily.   Yes [provider]  predniSONE (DELTASONE) 10 MG tablet Take 6 tabs PO day 1 and decrease by 1 tab daily until complete 11/20/20  Yes Eusebio Friendly B, PA-C  azelastine (ASTELIN) 0.1 % nasal spray Place into the nose. 09/29/20   [provider]  benzonatate (TESSALON) 100 MG capsule Take 2 capsules (200 mg total) by mouth every 8 (eight) hours. 10/01/20   Becky Augusta, NP  triamcinolone cream (KENALOG) 0.1 % Apply thin film topically 2 times daily as needed to eczema like skin rash 04/18/19   [provider]  ipratropium (ATROVENT) 0.06 % nasal spray Place 2 sprays into both nostrils 4 (four) times daily as needed for rhinitis. 08/25/19 08/24/20  Tommie Sams, DO    Family History Family History  Problem Relation Age of Onset   Hypertension Mother    CAD Mother    CAD Father  Breast cancer Neg Hx     Social History Social History   Tobacco Use   Smoking status: Former    Packs/day: 1.00    Years: 25.00    Pack years: 25.00    Types: Cigarettes    Quit date: 11/11/1989    Years since quitting: 31.0   Smokeless tobacco: Never  Vaping Use   Vaping Use: Never used  Substance Use Topics   Alcohol use: Yes    Comment: social   Drug use: No     Allergies   Azithromycin, Atorvastatin, Sulfa antibiotics, Zolpidem, and Amoxicillin   Review of Systems Review of Systems  Constitutional:  Negative for fatigue and fever.  HENT:  Negative for congestion and  trouble swallowing.   Respiratory:  Negative for chest tightness and shortness of breath.   Skin:  Positive for rash.  Neurological:  Negative for weakness.    Physical Exam Triage Vital Signs ED Triage Vitals  Enc Vitals Group     BP 11/20/20 1140 (!) 176/106     Pulse Rate 11/20/20 1140 87     Resp 11/20/20 1140 14     Temp 11/20/20 1140 98.3 F (36.8 C)     Temp Source 11/20/20 1140 Oral     SpO2 11/20/20 1140 100 %     Weight 11/20/20 1136 147 lb (66.7 kg)     Height 11/20/20 1136 5\' 4"  (1.626 m)     Head Circumference --      Peak Flow --      Pain Score 11/20/20 1136 0     Pain Loc --      Pain Edu? --      Excl. in GC? --    No data found.  Updated Vital Signs BP (!) 176/106 (BP Location: Left Arm)   Pulse 87   Temp 98.3 F (36.8 C) (Oral)   Resp 14   Ht 5\' 4"  (1.626 m)   Wt 147 lb (66.7 kg)   SpO2 100%   BMI 25.23 kg/m      Physical Exam Vitals and nursing note reviewed.  Constitutional:      General: She is not in acute distress.    Appearance: Normal appearance. She is not ill-appearing or toxic-appearing.  HENT:     Head: Normocephalic and atraumatic.  Eyes:     General: No scleral icterus.       Right eye: No discharge.        Left eye: No discharge.     Conjunctiva/sclera: Conjunctivae normal.  Cardiovascular:     Rate and Rhythm: Normal rate and regular rhythm.     Heart sounds: Normal heart sounds.  Pulmonary:     Effort: Pulmonary effort is normal. No respiratory distress.     Breath sounds: Normal breath sounds.  Musculoskeletal:     Cervical back: Neck supple.  Skin:    General: Skin is dry.     Findings: Rash (dry, raised patchy erythematous rash bilateral forearms and thighs) present.  Neurological:     General: No focal deficit present.     Mental Status: She is alert. Mental status is at baseline.     Motor: No weakness.     Gait: Gait normal.  Psychiatric:        Mood and Affect: Mood normal.        Behavior: Behavior  normal.        Thought Content: Thought content normal.     UC Treatments /  Results  Labs (all labs ordered are listed, but only abnormal results are displayed) Labs Reviewed - No data to display  EKG   Radiology No results found.  Procedures Procedures (including critical care time)  Medications Ordered in UC Medications - No data to display  Initial Impression / Assessment and Plan / UC Course  I have reviewed the triage vital signs and the nursing notes.  Pertinent labs & imaging results that were available during my care of the patient were reviewed by me and considered in my medical decision making (see chart for details).  70 year old female presenting for rash of bilateral arms and thighs over the past week.  Has had a similar rash in the past that has responded to corticosteroids.  Patient currently not responding to triamcinolone topical cream.  Prescribed prednisone for patient.  Advised her this could elevate her blood pressure and to discontinue it if she has significantly high blood pressures.  BP is currently 176/106 but recent visit to cardiology office was normal.  She may have some whitecoat hypertension.  I did place referral to dermatology for patient says she has had multiple flareups of this rash in the past year.  ED precautions reviewed.  Final Clinical Impressions(s) / UC Diagnoses   Final diagnoses:  Rash and nonspecific skin eruption     Discharge Instructions      Your rash may be consistent with eczema or allergic type rash.  I placed a referral to dermatology.  Until then continue taking her antihistamines and I have sent a prescription for oral prednisone.  Try not to scratch.  Follow-up with [place] as needed before seeing dermatology for any worsening of your condition.   ED Prescriptions     Medication Sig Dispense Auth. Provider   predniSONE (DELTASONE) 10 MG tablet Take 6 tabs PO day 1 and decrease by 1 tab daily until complete 21 tablet  Shirlee Latch, PA-C      PDMP not reviewed this encounter.   Shirlee Latch, PA-C 11/20/20 1219

## 2020-11-20 NOTE — ED Triage Notes (Signed)
Patient c/o itchy red rash on her arms and legs that started over a week ago.

## 2020-11-20 NOTE — Discharge Instructions (Addendum)
Your rash may be consistent with eczema or allergic type rash.  I placed a referral to dermatology.  Until then continue taking her antihistamines and I have sent a prescription for oral prednisone.  Try not to scratch.  Follow-up with [place] as needed before seeing dermatology for any worsening of your condition.

## 2020-12-13 ENCOUNTER — Ambulatory Visit: Admit: 2020-12-13 | Discharge: 2020-12-13 | Payer: MEDICARE

## 2020-12-13 DIAGNOSIS — K219 Gastro-esophageal reflux disease without esophagitis: Principal | ICD-10-CM

## 2020-12-13 DIAGNOSIS — R911 Solitary pulmonary nodule: Principal | ICD-10-CM

## 2020-12-13 DIAGNOSIS — J4521 Mild intermittent asthma with (acute) exacerbation: Principal | ICD-10-CM

## 2020-12-13 DIAGNOSIS — R053 Chronic cough: Principal | ICD-10-CM

## 2021-01-27 ENCOUNTER — Other Ambulatory Visit: Payer: Self-pay

## 2021-01-27 ENCOUNTER — Ambulatory Visit
Admission: RE | Admit: 2021-01-27 | Discharge: 2021-01-27 | Disposition: A | Discharge status: Home / Self Care | Payer: Medicare Other | Source: Ambulatory Visit | Attending: Gerontology | Admitting: Gerontology

## 2021-01-27 DIAGNOSIS — Z1231 Encounter for screening mammogram for malignant neoplasm of breast: Secondary | ICD-10-CM

## 2021-03-15 NOTE — Unmapped (Signed)
[facility]  Pulmonary Clinic - Follow-up Visit    Referring Physician :  [facility]  PCP:     [facility]  Reason for Consult:   F/u dyspnea & reactive airways    HISTORY:     Active Pulmonary Problems & Brief History:  Jasmine Carson is a 70 y.o. female former smoker (quit 1992, 25 pack year hx)  with PMHx hypertension, CKD, GERD and seasonal allergies recently admitted to [facility] for progressive cough, wheezing and dyspnea for 6 months. She was hypoxic in the ED but improved rapidly after steroids and nebs. Seen by the inpatient pulmonary consult team, at that time it was noted:   - Developed dry cough for the last 6 months which has progressively increased. Occ clear sputum. Wheezing prior to hospital admission. Improved with steroids previously. No triggering factors, no change in her cough pattern during the day or based on position. Occasionally coughs so hard that it gives her some pain in her chest and shortness of breath.    - In the last 2 to 3 months, she has had multiple visits to her primary care doctor in urgent care and has received different courses of antibiotics with steroids which make her feel better.  - Generally atopic and develops rash (eczema), sinus congestion, rhinorrhea when exposed to environmental allergens.   - Significant reflux symptoms improved on PPI - cough likewise improved. Her  ??She smoked for 25 years, 1 pack a day however quit more than 30 years ago.  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  She noted no change in her symptoms while she was working in the [employer].  CT imaging was done in the ED which did not show any PE however showed a subsolid 1 cm nodule in the lingula and no other parenchymal lung disease.    Interval History: Using flonase, singulair & cetirizine currently for allergy symptoms with improvement. GERD symptoms are well controlled on omeprazole.   Feels her shortness of breath and cough have entirely resolved. No wheezing. She used her Breo and combiventl inhalers for a short time after hospital discharge, but did not continue them after her symptoms had resolved.   She does have severe eczema and is waiting to be seen by derm again for this. Still having bad symptoms, drying, itching despite topical steroid.     Interval Hx 03/16/21: Doing well generally. Not needing her rescue inhaler. No exacerbations or PO steroids since our last visit. Breathing sx are worse in spring/fall. Takes daily antihistamine. Has chronic rhino-sinusitis symptoms with post-nasal drip & intermittent cough productive of clear phlegm, particularly in AM. Using flonase but not nasal saline spray. No nocturnal Sx. Eczema is still poorly controlled - although better than when I last saw her. She is using up topical steroid cream more quickly than her prescription allows.     Past Medical History:   Diagnosis Date   ??? Carpal tunnel syndrome    ??? CKD (chronic kidney disease)    ??? Eczema    ??? Gastroesophageal reflux    ??? Hypertension    ??? Seasonal allergies      Past Surgical History:   Procedure Laterality Date   ??? CARPAL TUNNEL RELEASE Right    ??? HYSTERECTOMY         Other History:  The social history and family history were personally reviewed and updated in the patient's electronic medical record.     Home Medications:  Albuterol  Fosamax  Bupropion  flonase  duonebs  Metoprolol  Montelukast  Omeprazole  Saline spray  HCTZ  Losartan    Allergies:  Allergies as of 03/16/2021 - Reviewed 12/13/2020   Allergen Reaction Noted   ??? Shrimp Hives and Rash 07/13/2020   ??? Sulfamethoxazole  10/15/2012   ??? Azithromycin Rash 10/15/2012   ??? Zolpidem Other (See Comments) 08/18/2013   ??? Amoxicillin Rash 06/09/2014   ??? Atorvastatin Nausea Only 01/09/2014       Review of Systems:  A comprehensive review of systems was completed and negative except as noted in HPI.    PHYSICAL EXAM:      Vitals:    03/16/21 1256   BP: 159/93   Pulse: 99   Resp: 18   Temp: 36.2 ??C (97.1 ??F)   SpO2: 96%     General: Alert, NAD, appears well nourished  Eyes: sclera clear, anicteric, conjunctiva without erythema/injection  HEENT: nares patent, oropharyx without exudate or erythema  Heart: S1, S2 normal, no murmurs rubs or gallops.  Lungs: symmetric excursion of the chest, normal WOB on RA, CTAB, no focal crackles or wheezes   MSK: No lower extremity edema  Skin: WWP, raised erythematous papules on hands & inner elbows  Neuro: Alert, oriented, follows commands. Moving all extremities spontaneously  Psych: No agitation, anxiety, or depression    LABORATORY and RADIOLOGY DATA:     Pulmonary Function Tests:       Pulmonary Function Testing:  Date FEV1  Pre/Post (%) FVC  Pre/Post (%) FEV1/FVC  Pre/Post (%) DLCO   12/13/2020  2.60 (104%) / ** (**%) 1.82 (93%) / ** (**%) 70% / **% ** (**%)            Pertinent Laboratory Data:  Eos 0.4 on 9/26, 0.6 on 8/4 (prior to steroids)  IgE significantly elevated at 794    Pertinent Imaging Data:  Images personally reviewed and discussed with attending   CTA Chest 10/22/20: 1.0 cm lingular lobe solid pulmonary nodule, borderline hilar nodes. Debris within the R sup seg bronchus. No PE.   CT chest w/o contrast: Persistent lingular semisolid nodule. 6 mo f/u    ASSESSMENT and PLAN     Jasmine Carson is a 70 y.o. 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 with rapid improvement after steroids, discharged on Breo, albuterol & ppi. PFTs today are normal.     1. Mild Intermittent Asthma: On initial visit Sx improved w/o need for Breo/combivent. FVL was normal, but w/atopic phenotype with allergy/eczema/gerd. I suspect that there underlying asthma given her rapid improvement with steroids in the hospital and frequent bronchitis episodes prior to hospital admission. She feels symptoms are worst in spring, summer & fall. We will start PRN symbicort for when respiratory sx recur & start Dupixent given her concomitant severe eczema.  - Continue allergy control with flonase, cetirizine, singulair  - Continue GERD control with PPI   - Start Symbicort SMART therapy  - Start Dupixent for asthma + severe eczema  - EpiPen ordered   - Flow-volume loop today  - f/u comprehensive environmental panel  - Referral to allergy placed for further testing & perhaps to transition care there if she wishes as her asthma is reasonably stable  2. Pulmonary Nodule: SPN 1.0 cm in lingula on 10/22/2020 CTA chest. Low risk given no smoking history. Persistent on follow up scan.  - Repeat chest imaging in 6 mo      Jasmine Carson was seen, examined and discussed with Dr. Judie Petit. Rolm Baptise who agrees with the assessment and plan above.   Follow up: No follow-ups on file.    GN:[facility], [facility]    Jasmine Kaman E. Talmadge Coventry, MD  Pulmonary & Critical Care Fellow  Pager: [phone]

## 2021-03-16 ENCOUNTER — Ambulatory Visit: Admit: 2021-03-16 | Discharge: 2021-03-16 | Payer: MEDICARE

## 2021-03-16 DIAGNOSIS — R0600 Dyspnea, unspecified: Principal | ICD-10-CM

## 2021-03-16 DIAGNOSIS — J4521 Mild intermittent asthma with (acute) exacerbation: Principal | ICD-10-CM

## 2021-03-16 DIAGNOSIS — R062 Wheezing: Principal | ICD-10-CM

## 2021-03-16 DIAGNOSIS — Z889 Allergy status to unspecified drugs, medicaments and biological substances status: Principal | ICD-10-CM

## 2021-03-16 DIAGNOSIS — L209 Atopic dermatitis, unspecified: Principal | ICD-10-CM

## 2021-03-16 MED ORDER — DUPILUMAB 300 MG/2 ML SUBCUTANEOUS PEN INJECTOR
Freq: Once | SUBCUTANEOUS | 0 refills | 0 days | Status: CP
Start: 2021-03-16 — End: 2021-03-16

## 2021-03-16 MED ORDER — DUPIXENT 300 MG/2 ML SUBCUTANEOUS PEN INJECTOR
SUBCUTANEOUS | 11 refills | 0 days | Status: CP
Start: 2021-03-16 — End: 2022-03-16

## 2021-03-16 MED ORDER — BUDESONIDE-FORMOTEROL HFA 80 MCG-4.5 MCG/ACTUATION AEROSOL INHALER
Freq: Two times a day (BID) | RESPIRATORY_TRACT | 11 refills | 31.00000 days | Status: CP
Start: 2021-03-16 — End: 2022-03-16

## 2021-03-16 MED ORDER — EPINEPHRINE 0.3 MG/0.3 ML INJECTION, AUTO-INJECTOR
Freq: Once | INTRAMUSCULAR | 1 refills | 0 days | Status: CP | PRN
Start: 2021-03-16 — End: 2022-03-16
  Filled 2021-03-23: qty 2, 28d supply, fill #0

## 2021-03-18 DIAGNOSIS — L209 Atopic dermatitis, unspecified: Principal | ICD-10-CM

## 2021-03-18 DIAGNOSIS — J4521 Mild intermittent asthma with (acute) exacerbation: Principal | ICD-10-CM

## 2021-03-18 DIAGNOSIS — Z889 Allergy status to unspecified drugs, medicaments and biological substances status: Principal | ICD-10-CM

## 2021-03-30 ENCOUNTER — Ambulatory Visit: Admit: 2021-03-30 | Discharge: 2021-03-31 | Payer: MEDICARE

## 2021-03-30 DIAGNOSIS — J310 Chronic rhinitis: Principal | ICD-10-CM

## 2021-04-05 DIAGNOSIS — J4520 Mild intermittent asthma, uncomplicated: Principal | ICD-10-CM

## 2021-04-05 DIAGNOSIS — L209 Atopic dermatitis, unspecified: Principal | ICD-10-CM

## 2021-04-05 MED ORDER — DUPIXENT 300 MG/2 ML SUBCUTANEOUS PEN INJECTOR
Freq: Once | SUBCUTANEOUS | 0 refills | 0 days | Status: CP
Start: 2021-04-05 — End: 2021-04-05
  Filled 2021-04-14: qty 4, 14d supply, fill #0

## 2021-04-05 NOTE — Unmapped (Signed)
[facility] Specialty Medication Onboarding    Specialty Medication: Dupixent 300 mg/2 mL pnij  Prior Authorization: Approved   Financial Assistance: No - copay  <$25  Final Copay/Day Supply: $0 / 28    Insurance Restrictions: None     Notes to Pharmacist: n/a    The triage team has completed the benefits investigation and has determined that the patient is able to fill this medication at [facility]. Please contact the patient to complete the onboarding or follow up with the prescribing physician as needed.

## 2021-04-06 NOTE — Unmapped (Signed)
Loading dose Dupixent Rx received  Copay $0/ 14 days  Was previous dose already scheduled to fill: No    Notes to Pharmacist:

## 2021-04-06 NOTE — Unmapped (Signed)
[facility]   Patient Onboarding/Medication Counseling    Jasmine Carson is a 71 y.o. female with mild asthma and concomitant atopic dermatitis who I am counseling today on initiation of therapy.  I am speaking to the patient.    Was a Nurse, learning disability used for this call? No    Verified patient's date of birth / HIPAA.    Specialty medication(s) to be sent: CF/Pulmonary: -Dupixent 300 mg/26mL      Non-specialty medications/supplies to be sent: n/a      Medications not needed at this time: n/a       The patient declined counseling on medication administration because she preferred to receive counseling in clinic. The information in the declined sections below are for informational purposes only and was not discussed with patient.     Dupixent (dupilumab)    Medication & Administration     Dosage: Mild Asthma and Atopic dermatitis: Inject 600mg  under the skin as a loading dose followed by 300mg  every 14 days thereafter    Administration:     Dupixent Pen  1. Gather all supplies needed for injection on a clean, flat working surface: medication syringe removed from packaging, alcohol swab, sharps container, etc.  2. Look at the medication label - look for correct medication, correct dose, and check the expiration date  3. Look at the medication - the liquid in the pen should appear clear and colorless to pale yellow  4. Lay the pen on a flat surface and allow it to warm up to room temperature for at least 45 minutes  5. Select injection site - you can use the front of your thigh or your belly (but not the area 2 inches around your belly button); if someone else is giving you the injection you can also use your upper arm in the skin covering your triceps muscle  6. Prepare injection site - wash your hands and clean the skin at the injection site with an alcohol swab and let it air dry, do not touch the injection site again before the injection  7. Hold the middle of the body of the pen and gently pull the needle safety cap straight out. Be careful not to bend the needle. Do not remove until immediately prior to injection  8. Press the pen down onto the injection site at a 90 degree angle.   9. You will hear a click as the injection starts, and then a second click when the injection is ALMOST done. Keep holding the pen against the skin for 5 more seconds after the second click.   10. Check that the pen is empty by looking in the viewing window - the yellow indicator bar should be stopped, and should fill the window.   11. Remove the pen from the skin by lifting straight up.   12. Dispose of the used pen immediately in your sharps disposal container  13. If you see any blood at the injection site, press a cotton ball or gauze on the site and maintain pressure until the bleeding stops, do not rub the injection site    Adherence/Missed dose instructions:  If a dose is missed, administer within 7 days from the missed dose and then resume the original schedule. If the missed dose is not administered within 7 days, you can either wait until the next dose on the original schedule or take your dose now and resume every 14 days from the new injection date. Do not use 2 doses  at the same time or extra doses.      Goals of Therapy     Atopic Dermatitis  -Reduce symptoms of pruritus and dermatitis  -Prevent exacerbations  -Minimize therapeutic risks  -Avoidance of long-term systemic and topical glucocorticoid use  -Maintenance of effective psychosocial functioning     Asthma  -Reduce impairment - few night-time awakenings; maintenance of normal daily activities; optimization of lung function  -Minimal need (less than 2 days per week) of inhaled short acting beta agonists to relieve symptoms  -Prevention of recurrent exacerbations and need for emergency department/hospital care  -Prevention of reduced lung growth in children  -Maintenance of effective psychosocial functioning    Side Effects & Monitoring Parameters     ??? Injection site reaction (redness, irritation, inflammation localized to the site of administration)  ??? Signs of a common cold - minor sore throat, runny or stuffy nose, etc.  ??? Recurrence of cold sores (herpes simplex)      The following side effects should be reported to the provider:  ??? Signs of a hypersensitivity reaction - rash; hives; itching; red, swollen, blistered, or peeling skin; wheezing; tightness in the chest or throat; difficulty breathing, swallowing, or talking; swelling of the mouth, face, lips, tongue, or throat; etc.  ??? Eye pain or irritation or any visual disturbances  ??? Shortness of breath or worsening of breathing      Contraindications, Warnings, & Precautions     ??? Have your bloodwork checked as you have been told by your prescriber   ??? Birth control pills and other hormone-based birth control may not work as well to prevent pregnancy  ??? Talk with your doctor if you are pregnant, planning to become pregnant, or breastfeeding  ??? Discuss the possible need for holding your dose(s) of Dupixent?? when a planned procedure is scheduled with the prescriber as it may delay healing/recovery timeline       Drug/Food Interactions     ??? Medication list reviewed in Epic. The patient was instructed to inform the care team before taking any new medications or supplements. No drug interactions identified.   ??? Talk with you prescriber or pharmacist before receiving any live vaccinations while taking this medication and after you stop taking it    Storage, Handling Precautions, & Disposal     ??? Store this medication in the refrigerator.  Do not freeze  ??? If needed, you may store at room temperature for up to 14 days  ??? Store in original packaging, protected from light  ??? Do not shake  ??? Dispose of used syringes in a sharps disposal container        Current Medications (including OTC/herbals), Comorbidities and Allergies     Current Outpatient Medications   Medication Sig Dispense Refill   ??? ACCU-CHEK GUIDE TEST STRIPS Strp      ??? albuterol HFA 90 mcg/actuation inhaler Inhale 2 puffs every six (6) hours as needed for wheezing or shortness of breath. 8 g 0   ??? alendronate (FOSAMAX) 70 MG tablet TAKE 1 TABLET BY MOUTH ONCE A WEEK     ??? amLODIPine (NORVASC) 5 MG tablet Take 5 mg by mouth daily.     ??? azelastine (ASTELIN) 137 mcg (0.1 %) nasal spray PLACE 1 SPRAY INTO BOTH NOSTRILS 2 (TWO) TIMES DAILY     ??? budesonide-formoteroL (SYMBICORT) 80-4.5 mcg/actuation inhaler Inhale 2 puffs Two (2) times a day. 10.2 g 11   ??? buPROPion (WELLBUTRIN XL) 150 MG 24 hr tablet  Take 150 mg by mouth daily.     ??? calcium carbonate-vitamin D3 (CALCIUM 600 WITH VITAMIN D3) 600 mg(1,500mg ) -400 unit cap Take 1 capsule by mouth once daily. 30 each 0   ??? cetirizine (ZYRTEC) 10 MG tablet Take 1 tablet by mouth daily.     ??? cysteamine bitartrate (PROCYSBI) 300 mg grdp 1 each.     ??? dupilumab (DUPIXENT PEN) 300 mg/2 mL PnIj Inject the contents of 1 pen (300 mg) under the skin every fourteen (14) days. 4 mL 11   ??? EPINEPHrine (EPIPEN) 0.3 mg/0.3 mL injection Inject 0.3 mL (0.3 mg total) into the muscle once as needed for anaphylaxis. 1 each 1   ??? fluticasone propionate (FLONASE) 50 mcg/actuation nasal spray 2 sprays into each nostril two (2) times a day. 32 g= 1 month supply 32 g 11   ??? ipratropium-albuteroL (COMBIVENT) 20-100 mcg/actuation Mist inhaler Use 4 times a day scheduled for 2 weeks, then change to 4 times a day AS NEEDED     ??? losartan (COZAAR) 50 MG tablet Take 0.5 tablets (25 mg total) by mouth in the morning for 20 days. 10 tablet 0   ??? lovastatin (MEVACOR) 40 MG tablet Take 40 mg by mouth daily with evening meal.     ??? metFORMIN (GLUCOPHAGE-XR) 500 MG 24 hr tablet TAKE 1 TABLET BY MOUTH EVERY DAY WITH DINNER     ??? metoprolol succinate (TOPROL-XL) 25 MG 24 hr tablet Take 25 mg by mouth once daily.     ??? montelukast (SINGULAIR) 10 mg tablet Take 10 mg by mouth.     ??? multivitamin-Ca-iron-minerals Tab Take 1 tablet by mouth daily.     ??? omeprazole (PRILOSEC) 20 MG capsule Take 20 mg by mouth once daily.     ??? sodium chloride (OCEAN) 0.65 % nasal spray 2 sprays into each nostril four (4) times a day as needed for congestion (Sinus dryness, congestion). Do not use within 2 hours of Flonase. 15 mL 0   ??? traZODone (DESYREL) 50 MG tablet      ??? triamcinolone (KENALOG) 0.1 % cream APPLY THIN FILM TOPICALLY 2 TIMES DAILY AS NEEDED TO ECZEMA LIKE SKIN RASH       No current facility-administered medications for this visit.       Allergies   Allergen Reactions   ??? Shrimp Hives and Rash   ??? Sulfamethoxazole      Other reaction(s): RASH   ??? Azithromycin Rash   ??? Zolpidem Other (See Comments)     sleepwalking  Other reaction(s): Other (See Comments)  sleepwalking  Other reaction(s): Other (See Comments)  sleepwalking     ??? Amoxicillin Rash   ??? Atorvastatin Nausea Only       Patient Active Problem List   Diagnosis   ??? Hypertension, benign   ??? Carpal tunnel syndrome   ??? Esophageal reflux   ??? Hyperlipidemia   ??? Chronic kidney disease (CKD), stage III (moderate) (CMS-HCC)   ??? Pulmonary nodule   ??? Hypoxemia   ??? Wheezing   ??? Chronic cough   ??? Age-related osteoporosis without current pathological fracture   ??? Atrial enlargement, bilateral   ??? Former smoker   ??? History of cervical cancer   ??? Insomnia   ??? Left hip pain   ??? Neutropenia (CMS-HCC)   ??? Prediabetes   ??? Seasonal allergies   ??? Situational depression   ??? Chronic eczema       Reviewed and up to date in Epic.  Appropriateness of Therapy     Acute infections noted within Epic:  No active infections  Patient reported infection: None    Is medication and dose appropriate based on diagnosis and infection status? Yes    Prescription has been clinically reviewed: Yes      Baseline Quality of Life Assessment      How many days over the past month did your atopic dermatitis and mild asthma  keep you from your normal activities? For example, brushing your teeth or getting up in the morning. Ms. Santmyer states atopic dermatitis has not cleared up. She uses hydocortisone cream to help reduce itching. In terms of asthma, she reports mostly wheezing at nigh and uses albuterol nightly.     Financial Information     Medication Assistance provided: Prior Authorization    Anticipated copay of $0 / 28 days reviewed with patient. Verified delivery address.    Delivery Information     Scheduled delivery date: 04/14/21    Expected start date: TBD - will need to schedule an appt for 1st dose    Medication will be delivered via [facility] - pulmonary clinic to the temporary address in [place].  This shipment will not require a signature.      Explained the services we provide at [facility] and that each month we would call to set up refills.  Stressed importance of returning phone calls so that we could ensure they receive their medications in time each month.  Informed patient that we should be setting up refills 7-10 days prior to when they will run out of medication.  A pharmacist will reach out to perform a clinical assessment periodically.  Informed patient that a welcome packet, containing information about our pharmacy and other support services, a Notice of Privacy Practices, and a drug information handout will be sent.      The patient or caregiver noted above participated in the development of this care plan and knows that they can request review of or adjustments to the care plan at any time.      Patient or caregiver verbalized understanding of the above information as well as how to contact the pharmacy at [phone] option 4 with any questions/concerns.  The pharmacy is open Monday through Friday 8:30am-4:30pm.  A pharmacist is available 24/7 via pager to answer any clinical questions they may have.    Patient Specific Needs     - Does the patient have any physical, cognitive, or cultural barriers? No    - Does the patient have adequate living arrangements? (i.e. the ability to store and take their medication appropriately) Yes    - Did you identify any home environmental safety or security hazards? No    - Patient prefers to have medications discussed with  Patient     - Is the patient or caregiver able to read and understand education materials at a high school level or above? Yes    - Patient's primary language is  English     - Is the patient high risk? No    SOCIAL DETERMINANTS OF HEALTH     At the [facility], we have learned that life circumstances - like trouble affording food, housing, utilities, or transportation can affect the health of many of our patients.   That is why we wanted to ask: are you currently experiencing any life circumstances that are negatively impacting your health and/or quality of life? Patient declined to answer  Social Determinants of Health     Food Insecurity: No Food Insecurity   ??? Worried About Programme researcher, broadcasting/film/video in the Last Year: Never true   ??? Ran Out of Food in the Last Year: Never true   Tobacco Use: Medium Risk   ??? Smoking Tobacco Use: Former   ??? Smokeless Tobacco Use: Never   ??? Passive Exposure: Not on file   Transportation Needs: No Transportation Needs   ??? Lack of Transportation (Medical): No   ??? Lack of Transportation (Non-Medical): No   Alcohol Use: Not on file   Housing/Utilities: Low Risk    ??? Within the past 12 months, have you ever stayed: outside, in a car, in a tent, in an overnight shelter, or temporarily in someone else's home (i.e. couch-surfing)?: No   ??? Are you worried about losing your housing?: No   ??? Within the past 12 months, have you been unable to get utilities (heat, electricity) when it was really needed?: No   Substance Use: Not on file   Financial Resource Strain: Low Risk    ??? Difficulty of Paying Living Expenses: Not hard at all   Physical Activity: Not on file   Health Literacy: Not on file   Stress: Not on file   Intimate Partner Violence: Not on file   Depression: Not on file   Social Connections: Not on file       Would you be willing to receive help with any of the needs that you have identified today? Not applicable       Oliva Bustard  [facility] Specialty Pharmacist

## 2021-04-11 NOTE — Unmapped (Signed)
Addended by: Viona Gilmore on: 04/10/2021 11:06 PM     Modules accepted: Level of Service

## 2021-04-12 NOTE — Unmapped (Addendum)
Called patient to check if they have received Dupixent injection.  Patient states they have received Epi pen but they needed to call pharmacist back and that injection would be sent to clinic first.  Informed patient that once clinic receives medication, they will be called to schedule appointment.  Patient verbalized understanding.    Called patient back to inform them that Dupixent should arrive to clinic on 04/14/2021.  Scheduled teaching appointment for 04/15/2021 at 11:00am.  Informed patient that they would need to be in clinic for one hour to be observed after injection and also to bring in Epi Pen with them.  Patient verbalized understanding.

## 2021-04-15 ENCOUNTER — Institutional Professional Consult (permissible substitution): Admit: 2021-04-15 | Discharge: 2021-04-16 | Payer: MEDICARE

## 2021-04-15 NOTE — Unmapped (Signed)
Pt arrived to clinic for teaching of Dupixent. Pt was given education on the purpose of medication and was informed of potential side effects. Pt was given instruction of how to prep, inject, and dispose of the medication. Pt was able to verify understanding using the teach back method.     Pt was able to safely inject loading dose of Dupixent medication into their right lower abdomen and left lower abdomen. No complications noted during injection. Pt stayed one hour after injection for monitoring. No signs of moderate/severe reaction noted during stay.  Instructed patient to call pharmacy for their next refill.    All questions and concerns appropriately answered during visit. Pt instructed to call the clinic nurse line should they develop any questions or concerns regarding medication.   Nursing Assessment completed.   Drug allergies assessed.     Med supplied: [facility] Supplied     Drug Administered: Dupixent 600mg    Route: SubQ  Frequency: 14 days     Right lower abdomen: 300 mg  Lot #: [id number] / Exp 02/17/2023    Left lower abdomen: 300 mg  Lot #: [id number] / Exp 02/17/2023    Assessment:  Tolerated procedure well, no side effects. Patient has epi pen.   Patient aware to contact office if side effects occur.     Delice Lesch, RN

## 2021-04-19 MED ORDER — EMPTY CONTAINER
0 refills | 0 days
Start: 2021-04-19 — End: ?

## 2021-04-19 NOTE — Unmapped (Signed)
[facility] Clinical Assessment & Refill Coordination Note    Ms. Balfour received 1st dose of Dupixent in clinic on 1/27 and tolerated it well.     Jasmine Carson, DOB: Jun 28, 1950  Phone: [phone] (home)     All above HIPAA information was verified with patient.     Was a Nurse, learning disability used for this call? No    Specialty Medication(s):   Inflammatory Disorders: Dupixent     Current Outpatient Medications   Medication Sig Dispense Refill   ??? ACCU-CHEK GUIDE TEST STRIPS Strp      ??? albuterol HFA 90 mcg/actuation inhaler Inhale 2 puffs every six (6) hours as needed for wheezing or shortness of breath. 8 g 0   ??? alendronate (FOSAMAX) 70 MG tablet TAKE 1 TABLET BY MOUTH ONCE A WEEK     ??? amLODIPine (NORVASC) 5 MG tablet Take 5 mg by mouth daily.     ??? azelastine (ASTELIN) 137 mcg (0.1 %) nasal spray PLACE 1 SPRAY INTO BOTH NOSTRILS 2 (TWO) TIMES DAILY     ??? budesonide-formoteroL (SYMBICORT) 80-4.5 mcg/actuation inhaler Inhale 2 puffs Two (2) times a day. 10.2 g 11   ??? buPROPion (WELLBUTRIN XL) 150 MG 24 hr tablet Take 150 mg by mouth daily.     ??? calcium carbonate-vitamin D3 (CALCIUM 600 WITH VITAMIN D3) 600 mg(1,500mg ) -400 unit cap Take 1 capsule by mouth once daily. 30 each 0   ??? cetirizine (ZYRTEC) 10 MG tablet Take 1 tablet by mouth daily.     ??? cysteamine bitartrate (PROCYSBI) 300 mg grdp 1 each.     ??? dupilumab (DUPIXENT PEN) 300 mg/2 mL PnIj Inject the contents of 1 pen (300 mg) under the skin every fourteen (14) days. 4 mL 11   ??? empty container Misc Use as directed 1 each 0   ??? EPINEPHrine (EPIPEN) 0.3 mg/0.3 mL injection Inject 0.3 mL (0.3 mg total) into the muscle once as needed for anaphylaxis. 1 each 1   ??? fluticasone propionate (FLONASE) 50 mcg/actuation nasal spray 2 sprays into each nostril two (2) times a day. 32 g= 1 month supply 32 g 11   ??? ipratropium-albuteroL (COMBIVENT) 20-100 mcg/actuation Mist inhaler Use 4 times a day scheduled for 2 weeks, then change to 4 times a day AS NEEDED     ??? losartan (COZAAR) 50 MG tablet Take 0.5 tablets (25 mg total) by mouth in the morning for 20 days. 10 tablet 0   ??? lovastatin (MEVACOR) 40 MG tablet Take 40 mg by mouth daily with evening meal.     ??? metFORMIN (GLUCOPHAGE-XR) 500 MG 24 hr tablet TAKE 1 TABLET BY MOUTH EVERY DAY WITH DINNER     ??? metoprolol succinate (TOPROL-XL) 25 MG 24 hr tablet Take 25 mg by mouth once daily.     ??? montelukast (SINGULAIR) 10 mg tablet Take 10 mg by mouth.     ??? multivitamin-Ca-iron-minerals Tab Take 1 tablet by mouth daily.     ??? omeprazole (PRILOSEC) 20 MG capsule Take 20 mg by mouth once daily.     ??? sodium chloride (OCEAN) 0.65 % nasal spray 2 sprays into each nostril four (4) times a day as needed for congestion (Sinus dryness, congestion). Do not use within 2 hours of Flonase. 15 mL 0   ??? traZODone (DESYREL) 50 MG tablet      ??? triamcinolone (KENALOG) 0.1 % cream APPLY THIN FILM TOPICALLY 2 TIMES DAILY AS NEEDED TO ECZEMA LIKE SKIN RASH  No current facility-administered medications for this visit.        Changes to medications: Esabella reports no changes at this time.    Allergies   Allergen Reactions   ??? Shrimp Hives and Rash   ??? Sulfamethoxazole      Other reaction(s): RASH   ??? Azithromycin Rash   ??? Zolpidem Other (See Comments)     sleepwalking  Other reaction(s): Other (See Comments)  sleepwalking  Other reaction(s): Other (See Comments)  sleepwalking     ??? Amoxicillin Rash   ??? Atorvastatin Nausea Only       Changes to allergies: No    SPECIALTY MEDICATION ADHERENCE     Dupixent 300 mg/20mL: 0 days of medicine on hand      Medication Adherence    Patient reported X missed doses in the last month: 0  Specialty Medication: Dupixent 300 mg/64mL - inject 600 mg on day 1 and THEN 300 mg every 14 days thereafter  Patient is on additional specialty medications: No  Informant: patient          Specialty medication(s) dose(s) confirmed: Regimen is correct and unchanged.     Are there any concerns with adherence? No    Adherence counseling provided? Not needed    CLINICAL MANAGEMENT AND INTERVENTION      Clinical Benefit Assessment:    Do you feel the medicine is effective or helping your condition? Yes    Clinical Benefit counseling provided? Not needed    Adverse Effects Assessment:    Are you experiencing any side effects? No    Are you experiencing difficulty administering your medicine? No    Quality of Life Assessment:    Quality of Life    Rheumatology  Oncology  Dermatology  Cystic Fibrosis          How many days over the past month did your atoptic dermatitis and mild asthma  keep you from your normal activities? For example, brushing your teeth or getting up in the morning. 0 - Ms. Rincon denies any flares or wheezing recently.     Have you discussed this with your provider? Not needed    Acute Infection Status:    Acute infections noted within Epic:  No active infections  Patient reported infection: None    Therapy Appropriateness:    Is therapy appropriate and patient progressing towards therapeutic goals? Yes, therapy is appropriate and should be continued    DISEASE/MEDICATION-SPECIFIC INFORMATION      For patients on injectable medications: Patient currently has 0 doses left.  Next injection is scheduled for 2/10.    PATIENT SPECIFIC NEEDS     - Does the patient have any physical, cognitive, or cultural barriers? No    - Is the patient high risk? No    - Does the patient require a Care Management Plan? No     SOCIAL DETERMINANTS OF HEALTH     At the [facility], we have learned that life circumstances - like trouble affording food, housing, utilities, or transportation can affect the health of many of our patients.   That is why we wanted to ask: are you currently experiencing any life circumstances that are negatively impacting your health and/or quality of life? Patient declined to answer    Social Determinants of Health     Food Insecurity: No Food Insecurity   ??? Worried About Programme researcher, broadcasting/film/video in the Last Year: Never true   ??? Ran Out of Food in  the Last Year: Never true   Tobacco Use: Medium Risk   ??? Smoking Tobacco Use: Former   ??? Smokeless Tobacco Use: Never   ??? Passive Exposure: Not on file   Transportation Needs: No Transportation Needs   ??? Lack of Transportation (Medical): No   ??? Lack of Transportation (Non-Medical): No   Alcohol Use: Not on file   Housing/Utilities: Low Risk    ??? Within the past 12 months, have you ever stayed: outside, in a car, in a tent, in an overnight shelter, or temporarily in someone else's home (i.e. couch-surfing)?: No   ??? Are you worried about losing your housing?: No   ??? Within the past 12 months, have you been unable to get utilities (heat, electricity) when it was really needed?: No   Substance Use: Not on file   Financial Resource Strain: Low Risk    ??? Difficulty of Paying Living Expenses: Not hard at all   Physical Activity: Not on file   Health Literacy: Not on file   Stress: Not on file   Intimate Partner Violence: Not on file   Depression: Not on file   Social Connections: Not on file       Would you be willing to receive help with any of the needs that you have identified today? Not applicable       SHIPPING     Specialty Medication(s) to be Shipped:   Inflammatory Disorders: Dupixent    Other medication(s) to be shipped: sharps container     Changes to insurance: No    Delivery Scheduled: Yes, Expected medication delivery date: 04/22/21.     Medication will be delivered via Same Day Courier to the confirmed prescription address in [facility].    The patient will receive a drug information handout for each medication shipped and additional FDA Medication Guides as required.  Verified that patient has previously received a Conservation officer, historic buildings and a Surveyor, mining.    The patient or caregiver noted above participated in the development of this care plan and knows that they can request review of or adjustments to the care plan at any time.      All of the patient's questions and concerns have been addressed.    Oliva Bustard   [facility] Specialty Pharmacist

## 2021-04-22 NOTE — Unmapped (Signed)
Jasmine Carson 's Dupixent shipment will be delayed as a result of the medication is too soon to refill until 04/24/21.     I have reached out to the patient  at [phone] and communicated the delivery change. We will reschedule the medication for the delivery date that the patient agreed upon.  We have confirmed the delivery date as 04/25/21, via same day courier.

## 2021-04-25 MED FILL — DUPIXENT 300 MG/2 ML SUBCUTANEOUS PEN INJECTOR: SUBCUTANEOUS | 28 days supply | Qty: 4 | Fill #0

## 2021-04-25 MED FILL — EMPTY CONTAINER: 90 days supply | Qty: 1 | Fill #0

## 2021-05-17 NOTE — Unmapped (Signed)
[facility] Refill Coordination Note    Specialty Medication(s) to be Shipped:   Inflammatory Disorders: Dupixent    Other medication(s) to be shipped: No additional medications requested for fill at this time     Jasmine Carson, DOB: 1951-01-18  Phone: [phone] (home)       All above HIPAA information was verified with patient.     Was a Nurse, learning disability used for this call? No    Completed refill call assessment today to schedule patient's medication shipment from the [facility] [phone]).  All relevant notes have been reviewed.     Specialty medication(s) and dose(s) confirmed: Regimen is correct and unchanged.   Changes to medications: Shequilla reports no changes at this time.  Changes to insurance: No  New side effects reported not previously addressed with a pharmacist or physician: None reported  Questions for the pharmacist: No    Confirmed patient received a Conservation officer, historic buildings and a Surveyor, mining with first shipment. The patient will receive a drug information handout for each medication shipped and additional FDA Medication Guides as required.       DISEASE/MEDICATION-SPECIFIC INFORMATION        For patients on injectable medications: Patient currently has 0 doses left.  Next injection is scheduled for 05/27/21.    SPECIALTY MEDICATION ADHERENCE     Medication Adherence    Patient reported X missed doses in the last month: 0  Specialty Medication: Dupixent 300mg /35ml Inj  Patient is on additional specialty medications: No  Patient is on more than two specialty medications: No  Informant: patient  Reliability of informant: reliable  Reasons for non-adherence: no problems identified  Confirmed plan for next specialty medication refill: delivery by pharmacy  Refills needed for supportive medications: not needed              Were doses missed due to medication being on hold? No    Dupixent 300mg /22ml Inj: 0 days of medicine on hand       REFERRAL TO PHARMACIST Referral to the pharmacist: Not needed      [facility]     Shipping address confirmed in Epic.     Delivery Scheduled: Yes, Expected medication delivery date: 05/24/21.     Medication will be delivered via Same Day Courier to the prescription address in Epic [place].    Wyatt Mage M Elisabeth Cara   [facility] Specialty Technician

## 2021-05-24 MED FILL — DUPIXENT 300 MG/2 ML SUBCUTANEOUS PEN INJECTOR: SUBCUTANEOUS | 28 days supply | Qty: 4 | Fill #1

## 2021-06-10 NOTE — Unmapped (Signed)
[facility] Refill Coordination Note    Specialty Medication(s) to be Shipped:   CF/Pulmonary: -Dupixent 300mg /47ml Inj Pen  Other medication(s) to be shipped: No additional medications requested for fill at this time     Jasmine Carson, DOB: 06/11/1950  Phone: [phone] (home)     All above HIPAA information was verified with patient.     Was a Nurse, learning disability used for this call? No    Completed refill call assessment today to schedule patient's medication shipment from the [facility] [phone]).  All relevant notes have been reviewed.     Specialty medication(s) and dose(s) confirmed: Regimen is correct and unchanged.   Changes to medications: Jasmine Carson reports no changes at this time.  Changes to insurance: No  New side effects reported not previously addressed with a pharmacist or physician: None reported  Questions for the pharmacist: No    Confirmed patient received a Conservation officer, historic buildings and a Surveyor, mining with first shipment. The patient will receive a drug information handout for each medication shipped and additional FDA Medication Guides as required.       DISEASE/MEDICATION-SPECIFIC INFORMATION        For patients on injectable medications: Patient currently has 0 doses left.  Next injection is scheduled for 06/24/2021.    SPECIALTY MEDICATION ADHERENCE     Medication Adherence    Patient reported X missed doses in the last month: 0  Specialty Medication: Dupixent 300mg /62ml Inj Pen  Patient is on additional specialty medications: No  Patient is on more than two specialty medications: No  Informant: patient  Reliability of informant: reliable  Reasons for non-adherence: no problems identified  Confirmed plan for next specialty medication refill: delivery by pharmacy  Refills needed for supportive medications: not needed        Were doses missed due to medication being on hold? No    Dupixent 300mg /76ml Inj Pen: 0 days of medicine on hand     REFERRAL TO PHARMACIST Referral to the pharmacist: Not needed    [facility]     Shipping address confirmed in Epic.     Delivery Scheduled: Yes, Expected medication delivery date: 06/20/2021.     Medication will be delivered via Same Day Courier to the prescription address in Epic [facility].    Jasmine Carson P Allena Katz   [facility] Specialty Technician

## 2021-06-14 ENCOUNTER — Other Ambulatory Visit: Payer: Self-pay

## 2021-06-14 ENCOUNTER — Ambulatory Visit (INDEPENDENT_AMBULATORY_CARE_PROVIDER_SITE_OTHER): Payer: Medicare Other | Admitting: Dermatology

## 2021-06-14 DIAGNOSIS — Z8709 Personal history of other diseases of the respiratory system: Secondary | ICD-10-CM

## 2021-06-14 DIAGNOSIS — L650 Telogen effluvium: Secondary | ICD-10-CM

## 2021-06-14 DIAGNOSIS — L209 Atopic dermatitis, unspecified: Secondary | ICD-10-CM | POA: Diagnosis not present

## 2021-06-14 DIAGNOSIS — I8393 Asymptomatic varicose veins of bilateral lower extremities: Secondary | ICD-10-CM

## 2021-06-14 DIAGNOSIS — L649 Androgenic alopecia, unspecified: Secondary | ICD-10-CM

## 2021-06-14 DIAGNOSIS — Z79899 Other long term (current) drug therapy: Secondary | ICD-10-CM | POA: Diagnosis not present

## 2021-06-14 NOTE — Progress Notes (Signed)
? ?New Patient Visit ? ?Subjective  ?Debra Alexander is a 71 y.o. female who presents for the following: New Patient (Initial Visit) (Patient here today concerning eczema, patient is currently taking dupixent and states that it has helped a lot. patient reports recent allergic reaction and skin broke out in rash with dry patches. Patient  was prescribed tmc. Patient reports rash would appear at legs arms and back. ). Currently rash has cleared up. ? ?Patient reports hair has fallen out after being hospitalized for severe breathing problems end of August 2022. She already had hair thinning, but it worsened after hospitalization. There is also a family h/o hair loss in men.  Thyroid is normal at this time.  Patient reports history of asthma and been on dupixent for 6 - 8 weeks. She also has seasonal allergies. ? ?The following portions of the chart were reviewed this encounter and updated as appropriate:  ?  ?  ? ?Review of Systems:  No other skin or systemic complaints except as noted in HPI or Assessment and Plan. ? ?Objective  ?Well appearing patient in no apparent distress; mood and affect are within normal limits. ? ?A focused examination was performed including scalp, arms, legs. Relevant physical exam findings are noted in the Assessment and Plan. ? ?hands and body ?Hyperpigmented patches on hands and pretibial, no erythema/scale, no active areas today ? ?Scalp ?Diffuse thinning of the crown with loss of follicles and widening of the midline part with retention of the frontal hairline - Reviewed progressive nature and prognosis.  ? ? ? ? ? ? ? ? ? ? ? ? ? ? ?Assessment & Plan  ?Atopic dermatitis, unspecified type ?hands and body ? ?Chronic condition with duration or expected duration over one year. Currently well-controlled. ?Patient has history of seasonal allergies and asthma  ? ?Atopic dermatitis (eczema) is a chronic, relapsing, pruritic condition that can significantly affect quality of life. It is often  associated with allergic rhinitis and/or asthma and can require treatment with topical medications, phototherapy, or in severe cases biologic injectable medication (Dupixent; Adbry) or Oral JAK inhibitors. ? ?Continue Dupixent injections every 2 weeks as prescribed by pulmonary provider ? ?No topicals needed at this time. ? ?Recommend mild soap and moisturizing cream 1-2 times daily.  Gentle skin care handout provided.   ? ?Dupilumab (Dupixent) is a treatment given by injection for adults and children with moderate-to-severe atopic dermatitis. Goal is control of skin condition, not cure. It is given as 2 injections at the first dose followed by 1 injection ever 2 weeks thereafter.  Young children are dosed monthly. ? ?Potential side effects include allergic reaction, herpes infections, injection site reactions and conjunctivitis (inflammation of the eyes).  The use of Dupixent requires long term medication management, including periodic office visits.  ? ? ? 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 ?With recent telogen effluvium due to h/o severe asthma requiring hospitalization.  ? ? ?Telogen effluvium is a benign, self-limited condition causing increased hair shedding usually for several months. It does not progress to baldness, and the hair eventually grows back on its own. It can be triggered by recent illness, recent surgery, thyroid disease, low iron stores, vitamin D deficiency, fad diets or rapid weight loss, hormonal changes such as pregnancy or birth control pills, and some medication. Usually the hair loss starts 2-3 months after the illness or health change. Rarely, it can continue for longer than a year. ? ?Recent labs reviewed and Thyroid and CBC normal.  Will check  ferritin and Vit D levels ? ?Female Androgenic Alopecia is a chronic condition related to genetics and/or hormonal changes.  In women androgenetic alopecia is commonly associated with menopause but may occur any time after puberty.  It causes hair  thinning primarily on the crown with widening of the part and temporal hairline recession.  Can use OTC Rogaine (minoxidil) 5% solution/foam as directed.  ?Oral treatments in female patients who have no contraindication may include : ?- Low dose oral minoxidil 1.25 - 5mg  daily ?- Spironolactone 50 - 100mg  bid ?- Finasteride 2.5 - 5 mg daily ?Adjunctive therapies include: ?- Low Level Laser Light Therapy (LLLT) ?- Platelet-rich plasma injections (PRP) ?- Hair Transplants or scalp reduction  ? ?Recommend minoxidil 5% (Rogaine for men) solution or foam to be applied to the scalp and left in. This should ideally be used twice daily for best results but it helps with hair regrowth when used at least three times per week. Rogaine initially can cause increased hair shedding for the first few weeks but this will stop with continued use. In studies, people who used minoxidil (Rogaine) for at least 6 months had thicker hair than people who did not. Minoxidil topical (Rogaine) only works as long as it continues to be used. If if it is no longer used then the hair it has been helping to regrow can fall out. Minoxidil topical (Rogaine) can cause increased facial hair growth which can usually be managed easily with a battery-operated hair trimmer. If facial hair growth is bothersome, switching to the 2% women's version can decrease the risk of unwanted facial hair growth. ? ?Or ? ?Discussed talking with PCP about starting low dose oral minoxidil, 2.5 mg tablet 1/2 tab by mouth daily . Pt on three medications for HTN.  Can send into pharmacy if okay to add to current medications.  May also consider finasteride 2.5 mg QD ? ? ? ? ? ?Ferritin - Scalp ? ?Vitamin D, 25-hydroxy - Scalp ? ? ?Varicose Veins/Spider Veins ?- Dilated blue, purple or red veins at the lower extremities ?- Reassured ?- Smaller vessels can be treated by sclerotherapy (a procedure to inject a medicine into the veins to make them disappear) if desired, but the  treatment is not covered by insurance. Larger vessels may be covered if symptomatic and we would refer to vascular surgeon if treatment desired. ? ?Return for 3 - 4 months alopecia and eczema . ? ? ?I, , CMA, am acting as scribe for , MD. ? ?Documentation: I have reviewed the above documentation for accuracy and completeness, and I agree with the above. ? ?Asher Muir MD  ? ?

## 2021-06-14 NOTE — Patient Instructions (Addendum)
Telogen effluvium is a benign, self-limited condition causing increased hair shedding usually for several months. It does not progress to baldness, and the hair eventually grows back on its own. It can be triggered by recent illness, recent surgery, thyroid disease, low iron stores, vitamin D deficiency, fad diets or rapid weight loss, hormonal changes such as pregnancy or birth control pills, and some medication. Usually the hair loss starts 2-3 months after the illness or health change. Rarely, it can continue for longer than a year. ? ?Discuss with primary doctor minoxidil 2.5 mg tablet take 1/2 tablet daily for hair loss  ? ?Recommend minoxidil 5% (Rogaine for men) solution or foam to be applied to the scalp and left in. This should ideally be used twice daily for best results but it helps with hair regrowth when used at least three times per week. Rogaine initially can cause increased hair shedding for the first few weeks but this will stop with continued use. In studies, people who used minoxidil (Rogaine) for at least 6 months had thicker hair than people who did not. Minoxidil topical (Rogaine) only works as long as it continues to be used. If if it is no longer used then the hair it has been helping to regrow can fall out. Minoxidil topical (Rogaine) can cause increased facial hair growth which can usually be managed easily with a battery-operated hair trimmer. If facial hair growth is bothersome, switching to the 2% women's version can decrease the risk of unwanted facial hair growth. ? ? ?If You Need Anything After Your Visit ? ?If you have any questions or concerns for your doctor, please call our main line at [phone] and press option 4 to reach your doctor's medical assistant. If no one answers, please leave a voicemail as directed and we will return your call as soon as possible. Messages left after 4 pm will be answered the following business day.  ? ?You may also send [place] a message via MyChart.  We typically respond to MyChart messages within 1-2 business days. ? ?For prescription refills, please ask your pharmacy to contact our office. Our fax number is [phone]. ? ?If you have an urgent issue when the clinic is closed that cannot wait until the next business day, you can page your doctor at the number below.   ? ?Please note that while we do our best to be available for urgent issues outside of office hours, we are not available 24/7.  ? ?If you have an urgent issue and are unable to reach [place], you may choose to seek medical care at your doctor's office, retail clinic, urgent care center, or emergency room. ? ?If you have a medical emergency, please immediately call 911 or go to the emergency department. ? ?Pager Numbers ? ?- Dr. Gwen Pounds: [phone] ? ?- Dr. Neale Burly: [phone] ? ?- Dr. Roseanne Reno: [phone] ? ?In the event of inclement weather, please call our main line at [phone] for an update on the status of any delays or closures. ? ?Dermatology Medication Tips: ?Please keep the boxes that topical medications come in in order to help keep track of the instructions about where and how to use these. Pharmacies typically print the medication instructions only on the boxes and not directly on the medication tubes.  ? ?If your medication is too expensive, please contact our office at [phone] option 4 or send [place] a message through MyChart.  ? ?We are unable to tell what your co-pay for medications will be in advance  as this is different depending on your insurance coverage. However, we may be able to find a substitute medication at lower cost or fill out paperwork to get insurance to cover a needed medication.  ? ?If a prior authorization is required to get your medication covered by your insurance company, please allow [place] 1-2 business days to complete this process. ? ?Drug prices often vary depending on where the prescription is filled and some pharmacies may offer cheaper prices. ? ?The  website www.goodrx.com contains coupons for medications through different pharmacies. The prices here do not account for what the cost may be with help from insurance (it may be cheaper with your insurance), but the website can give you the price if you did not use any insurance.  ?- You can print the associated coupon and take it with your prescription to the pharmacy.  ?- You may also stop by our office during regular business hours and pick up a GoodRx coupon card.  ?- If you need your prescription sent electronically to a different pharmacy, notify our office through [facility] or by phone at [phone] option 4. ? ? ? ? ?Si Usted Necesita Algo Despu?s de Su Visita ? ?Tambi?n puede enviarnos un mensaje a trav?s de MyChart. Por lo general respondemos a los mensajes de MyChart en el transcurso de 1 a 2 d?as h?biles. ? ?Para renovar recetas, por favor pida a su farmacia que se ponga en contacto con nuestra oficina. Nuestro n?mero de fax es el [phone]. ? ?Si tiene un asunto urgente cuando la cl?nica est? cerrada y que no puede esperar hasta el siguiente d?a h?bil, puede llamar/localizar a su doctor(a) al n?mero que aparece a continuaci?n.  ? ?Por favor, tenga en cuenta que aunque hacemos todo lo posible para estar disponibles para asuntos urgentes fuera del horario de oficina, no estamos disponibles las 24 horas del d?a, los 7 d?as de la semana.  ? ?Si tiene un problema urgente y no puede comunicarse con nosotros, puede optar por buscar atenci?n m?dica  en el consultorio de su doctor(a), en una cl?nica privada, en un centro de atenci?n urgente o en una sala de emergencias. ? ?Si tiene Radio broadcast assistant m?dica, por favor llame inmediatamente al 911 o vaya a la sala de emergencias. ? ?N?meros de b?per ? ?- Dr. Gwen Pounds: [phone] ? ?- Dra. Moye: [phone] ? ?- Dra. Roseanne Reno: [phone] ? ?En caso de inclemencias del tiempo, por favor llame a nuestra l?nea principal al [phone] para una  actualizaci?n sobre el estado de cualquier retraso o cierre. ? ?Consejos para la medicaci?n en dermatolog?a: ?Por favor, guarde las cajas en las que vienen los medicamentos de uso t?pico para ayudarle a seguir las instrucciones sobre d?nde y c?mo usarlos. Las farmacias generalmente imprimen las instrucciones del medicamento s?lo en las cajas y no directamente en los tubos del [place].  ? ?Si su medicamento es muy caro, por favor, p?ngase en contacto con Rolm Gala llamando al [phone] y presione la opci?n 4 o env?enos un mensaje a trav?s de MyChart.  ? ?No podemos decirle cu?l ser? su copago por los medicamentos por adelantado ya que esto es diferente dependiendo de la cobertura de su seguro. Sin embargo, es posible que podamos encontrar un medicamento sustituto a Audiological scientist un formulario para que el seguro cubra el medicamento que se considera necesario.  ? ?Si se requiere Neomia Dear autorizaci?n previa para que su compa??a de seguros [place] su medicamento, por favor perm?tanos de 1 a 2 d?as  h?biles para completar este proceso. ? ?Los precios de los medicamentos var?an con frecuencia dependiendo del lugar de d?nde se surte la receta y alguna farmacias pueden ofrecer precios m?s baratos. ? ?El sitio web www.goodrx.com tiene cupones para medicamentos de diferentes farmacias. Los precios aqu? no tienen en cuenta lo que podr?a costar con la ayuda del seguro (puede ser m?s barato con su seguro), pero el sitio web puede darle el precio si no utiliz? ning?n seguro.  ?- Puede imprimir el cup?n correspondiente y llevarlo con su receta a la farmacia.  ?- Tambi?n puede pasar por nuestra oficina durante el horario de atenci?n regular y recoger una tarjeta de cupones de GoodRx.  ?- Si necesita que su receta se env?e electr?nicamente a una farmacia diferente, informe a nuestra oficina a trav?s de MyChart de  o por tel?fono llamando al [phone] y presione la opci?n 4. ? ?

## 2021-06-20 MED FILL — DUPIXENT 300 MG/2 ML SUBCUTANEOUS PEN INJECTOR: SUBCUTANEOUS | 28 days supply | Qty: 4 | Fill #2

## 2021-06-23 LAB — FERRITIN: Ferritin: 486 ng/mL — ABNORMAL HIGH (ref 15–150)

## 2021-06-29 ENCOUNTER — Telehealth: Payer: Self-pay

## 2021-06-29 NOTE — Telephone Encounter (Signed)
-----   Message from Brendolyn Patty, MD sent at 06/29/2021 10:50 AM EDT ----- ?Ferritin is high (low values cause hair loss), Vit D pending ? ?- please forward Ferritin lab to PCP and call patient ?

## 2021-06-29 NOTE — Telephone Encounter (Signed)
Advised pt of labs and routed pt's labs to pt's PCP Lorenso Quarry NP at [facility]./sh ?

## 2021-07-04 ENCOUNTER — Ambulatory Visit: Admit: 2021-07-04 | Discharge: 2021-07-05 | Payer: MEDICARE

## 2021-07-04 DIAGNOSIS — Z889 Allergy status to unspecified drugs, medicaments and biological substances status: Principal | ICD-10-CM

## 2021-07-04 DIAGNOSIS — J3089 Other allergic rhinitis: Principal | ICD-10-CM

## 2021-07-04 DIAGNOSIS — L209 Atopic dermatitis, unspecified: Principal | ICD-10-CM

## 2021-07-04 DIAGNOSIS — J4521 Mild intermittent asthma with (acute) exacerbation: Principal | ICD-10-CM

## 2021-07-04 MED ORDER — TRIAMCINOLONE ACETONIDE 0.1 % TOPICAL OINTMENT
Freq: Two times a day (BID) | TOPICAL | 0 refills | 0 days | Status: CP
Start: 2021-07-04 — End: 2022-07-04

## 2021-07-04 NOTE — Unmapped (Addendum)
Allergy/Immunology   Clinic Note  New Patient     Referring Physician:    Truett Mainland, MD  [address]  [address]  [place],  [place] [address]    Reason for Referral: eczema, asthma, rhinitis    Assessment and Plan:   Diagnoses:  1. Atopic dermatitis, unspecified type    2. Atopy    3. Mild intermittent reactive airway disease with acute exacerbation    4. Seasonal allergic rhinitis due to other allergic trigger        Assessment and Plan:  Jasmine Carson is a 71 y.o. female seen for the following:    Allergic Rhinitis (nasal allergies): The history is consistent with both seasonal and perennial allergies. Blood testing confirmed sensitivity to mold.   Medications are as described below.   - Flonase nasal spray,2  sprays per nostril BID as prescribed by ENT. Astelin 1 spay daily. Nasal sprays only work if used daily. Remember to tilt your head down, look at the floor, point the spray to the outside wall of your nose (towards the same-side ear) and spray. If you have to sniff, sniff gently. Do not sniff hard, you want the medication to stay in your nose. Take medications year-round.   -continue Singulair  - Cetirizine (Zyrtec) 10 mg daily as needed if still having symptoms   - Nasal saline rinses on a daily or twice daily basis using the sinus rinse kit.   - For ocular symptoms, rinse eyes with chilled artificial tears, followed by ketotifen (Alaway/Zaditor) eye drops (1 drop per eye up to 3x daily as needed.)      Atopic Dermatitis:  Jasmine Carson has moderate atopic dermatitis, which appears to be well controlled.   - treat eczema flares on the body with triamcinolone 0.1% ointment  and eczema flares on the face (or mild flares) with hydrocortisone 2.5% ointment , 2 times a day for 5-7 days to affected areas on body  - daily baths in warm water with moisturizing soon after bathing.  Minimize soap use, and use instead moisturizing bars, like those made by [employer] [place], Aveeno, Cetaphil, Cerave.   - use unscented thick moisturizer ointment/cream that comes in a tub, like petroleum jelly / Vaseline, Aquaphor, Cerave Healing Ointment (not the renewing cream),  or Cetaphil   - avoid lotions and liquid soaps as they can be drying.  Avoid scrubbing skin with washcloths/loofahs.    - Continue Dupixent 300mg  q2 weeks. She does see a dermatologist for this as well, who recommended remaining on Dupixent for eczema. We discussed can continue to monitor this, and if is still doing well in 6 months, may discuss if she would want to considering coming off of Dupixent, or staying on this long term    Asthma, mild intermittent  Good control, follows with pulmonology. Continue Symbicort PRN. Also on Dupixent a prescribed by pulmonology. Discussed that if her asthma stays under good control as well as her asthma, she could consider trialing off of this if she wants to consider stopping in 6 months.    Follow-up:  Return in about 6 months (around 01/03/2022).      Patient discussed with the attending, Dr. Elisabeth Most, with whom the above assessment and plan were jointly formulated.    --  Melbourne Abts, MD  Allergy/Immunology Fellow   [facility]    I personally spent 60 minutes face-to-face and non-face-to-face in the care of this patient, which includes all pre,  intra, and post visit time on the date of service.    Subjective:   HPI:  I had the pleasure of seeing Jasmine Carson in allergy/immunology clinic today, and she is a 71 y.o. female w/ a hx of hypertension, hyperlipidemia, CKD stage III, mild intermittent reactive airway disease, chronic cough, prediabetes, GERD, former tobacco use. seen in consultation at the request of Dr. Truett Mainland for further evaluation of eczema, allergic rhinitis. A thorough review of the available medical records is also performed.    Atopic dermatitis for 40 years. Unclear triggers. All year round. On arms, hands, legs, abdomen. Every month would flare before Dupixent. She would use triamcinolone 0.1% ointment which would help. No flares since Dupixent. Does not use loofas. Uses non scented moisturizing bars. No skin infections. Given mild asthma, eczema pulm at [facility] started her on Dupixent. Has seen dermatology 3/28 to establish care. Uses a moisturizer daily to BID (Vaseline or Aveeno).     Rhinitis. Patient with symptoms of nasal congestion, itching, post nasal drip all year round, worse March to May, August to October. Symptoms are triggered by cold, strong scents. No pets. Have tried: Zyrtec 10 mg, Flonase 2 sprays BID, Astelin 1 spray, Singulair 10mg , sinus rinse once a day. Well controlled on this regimen, but still with some symptoms. Since starting Dupixent has had improvement. Follows with ENT here, last seen 03/2021. Not interested in immunotherapy. Tested positive to mold by blood (ordered by pulm).    History of asthma dx last year. Hospitalized August 2022 for asthma (wheezing, difficulty breathing), rapidly improved with steroids and nebulizer. Leading up to this admission, had 6 months of cough and sinus infections. Had multipe rounds of pred/antibiotics. At dischare told to take [facility] and combivent, but was so improved stopped it. Established care with pulm and is on PRN Symbicort and Dupixent given allergies (started 03/2021). Last took Symbicort 3 months ago. Improvement w/ Dupixent.      Review of Systems:  As per HPI, all other systems reviewed are negative.    Past Medical History:     Past Medical History:   Diagnosis Date    Carpal tunnel syndrome     CKD (chronic kidney disease)     Eczema     Gastroesophageal reflux     Hypertension     Seasonal allergies        Past Surgical History:   Procedure Laterality Date    CARPAL TUNNEL RELEASE Right     HYSTERECTOMY         Medications:     Current Outpatient Medications   Medication Sig Dispense Refill    ACCU-CHEK GUIDE TEST STRIPS Strp       albuterol HFA 90 mcg/actuation inhaler Inhale 2 puffs every six (6) hours as needed for wheezing or shortness of breath. 8 g 0    alendronate (FOSAMAX) 70 MG tablet TAKE 1 TABLET BY MOUTH ONCE A WEEK      amLODIPine (NORVASC) 5 MG tablet Take 5 mg by mouth daily.      azelastine (ASTELIN) 137 mcg (0.1 %) nasal spray PLACE 1 SPRAY INTO BOTH NOSTRILS 2 (TWO) TIMES DAILY      budesonide-formoteroL (SYMBICORT) 80-4.5 mcg/actuation inhaler Inhale 2 puffs Two (2) times a day. 10.2 g 11    buPROPion (WELLBUTRIN XL) 150 MG 24 hr tablet Take 150 mg by mouth daily.      calcium carbonate-vitamin D3 (CALCIUM 600 WITH VITAMIN D3) 600 mg(1,500mg ) -400 unit cap  Take 1 capsule by mouth once daily. 30 each 0    cetirizine (ZYRTEC) 10 MG tablet Take 1 tablet by mouth daily.      cysteamine bitartrate (PROCYSBI) 300 mg grdp 1 each.      dupilumab (DUPIXENT PEN) 300 mg/2 mL PnIj Inject the contents of 1 pen (300 mg) under the skin every fourteen (14) days. 4 mL 11    empty container Misc Use as directed 1 each 0    EPINEPHrine (EPIPEN) 0.3 mg/0.3 mL injection Inject 0.3 mL (0.3 mg total) into the muscle once as needed for anaphylaxis. 1 each 1    fluticasone propionate (FLONASE) 50 mcg/actuation nasal spray 2 sprays into each nostril two (2) times a day. 32 g= 1 month supply 32 g 11    ipratropium-albuteroL (COMBIVENT) 20-100 mcg/actuation Mist inhaler Use 4 times a day scheduled for 2 weeks, then change to 4 times a day AS NEEDED      losartan (COZAAR) 50 MG tablet Take 0.5 tablets (25 mg total) by mouth in the morning for 20 days. 10 tablet 0    lovastatin (MEVACOR) 40 MG tablet Take 40 mg by mouth daily with evening meal.      metFORMIN (GLUCOPHAGE-XR) 500 MG 24 hr tablet TAKE 1 TABLET BY MOUTH EVERY DAY WITH DINNER      metoprolol succinate (TOPROL-XL) 25 MG 24 hr tablet Take 25 mg by mouth once daily.      montelukast (SINGULAIR) 10 mg tablet Take 10 mg by mouth.      multivitamin-Ca-iron-minerals Tab Take 1 tablet by mouth daily.      omeprazole (PRILOSEC) 20 MG capsule Take 20 mg by mouth once daily. sodium chloride (OCEAN) 0.65 % nasal spray 2 sprays into each nostril four (4) times a day as needed for congestion (Sinus dryness, congestion). Do not use within 2 hours of Flonase. 15 mL 0    traZODone (DESYREL) 50 MG tablet       triamcinolone (KENALOG) 0.1 % cream APPLY THIN FILM TOPICALLY 2 TIMES DAILY AS NEEDED TO ECZEMA LIKE SKIN RASH       No current facility-administered medications for this visit.       Allergies:     Allergies   Allergen Reactions    Shrimp Hives and Rash    Sulfamethoxazole      Other reaction(s): RASH    Azithromycin Rash    Zolpidem Other (See Comments)     sleepwalking  Other reaction(s): Other (See Comments)  sleepwalking  Other reaction(s): Other (See Comments)  sleepwalking      Amoxicillin Rash    Atorvastatin Nausea Only       Family History:     Family History   Problem Relation Age of Onset    COPD Neg Hx     Lung cancer Neg Hx     Asthma Neg Hx        Social History:     Social History     Socioeconomic History    Marital status: Married   Tobacco Use    Smoking status: Former     Types: Cigarettes    Smokeless tobacco: Never   Vaping Use    Vaping Use: Never used   Substance and Sexual Activity    Alcohol use: Yes     Alcohol/week: 2.0 standard drinks     Types: 2 Glasses of wine per week     Comment: 2 drinks weekly    Drug use: No  Social Determinants of Health     Financial Resource Strain: Low Risk     Difficulty of Paying Living Expenses: Not hard at all   Food Insecurity: No Food Insecurity    Worried About Programme researcher, broadcasting/film/video in the Last Year: Never true    Barista in the Last Year: Never true   Transportation Needs: No Transportation Needs    Lack of Transportation (Medical): No    Lack of Transportation (Non-Medical): No       Objective:   PE:   Vitals:    07/04/21 1319   BP: 145/93   BP Site: L Arm   BP Position: Sitting   BP Cuff Size: Large   Pulse: 82   SpO2: 97%       General:  Well nourished female in no acute distress without toxic appearance.  Skin:  No eczematous patches or rash. No dermatographism.  HEENT:  No conjunctival injection, non-prominent infraorbital folds; Anterior nasal examination: pink nasal mucosa, non-edematous inferior turbinates, no nasal septal deviation or visible polyps; granular oropharynx without exudates, ulcerations, or thrush; no frontal or maxillary sinus tenderness  Neck:  Supple with FROM. No thyromegaly.  CV: Regular rhythm; normal S1 and S2; no murmur, gallop or rub.  Respiratory:  Adequate inspiratory effort. Clear to auscultation bilaterally. No wheezes, crackles, or rhonchi.    Neurologic:  Alert and mental status appropriate for age; normal gait.  Musculoskeletal:  No peripheral edema. Normal bulk for age  Psychiatric: Relaxed, friendly, and cooperative with interview and examination. Euthymic affect. Normal thought process and thought content.    Investigations      Laboratory testing reviewed and pertinent for the following:    No visits with results within 4 Week(s) from this visit.   Latest known visit with results is:   Office Visit on 03/16/2021   Component Date Value    Cocklebur IgE 03/16/2021 <0.35     Cat dander IgE 03/16/2021 <0.35     [place] [employer] Poplar* 03/16/2021 <0.35     Dog Dander IgE 03/16/2021 <0.35     D. farinae IgE 03/16/2021 <0.35     D. pteronyssinus IgE 03/16/2021 <0.35     [place] Grass IgE 03/16/2021 <0.35     Johnson Grass IgE 03/16/2021 <0.35     Timothy Grass IgE 03/16/2021 <0.35     Alternaria alternata IgE 03/16/2021 2.42 (H)     Candida Albicans IgE 03/16/2021 <0.35     Cladosporium Herbarum IgE 03/16/2021 <0.35     Epicoccum purpurascens I* 03/16/2021 <0.35     Fusarium Proliferatum IgE 03/16/2021 <0.35     Aspergillus fumigatus IgE 03/16/2021 <0.35     Mucor Racemosus IgE 03/16/2021 <0.35     Aspergillus [place] IgE 03/16/2021 <0.35     Mouse IgE 03/16/2021 <0.35     P chrysogenum (P notatum* 03/16/2021 <0.35     Rhizopus nigrican IgE 03/16/2021 <0.35     Trichophyton rubrum IgE 03/16/2021 <0.35     Setomelanomma rostrata (* 03/16/2021 <0.35     Mugwort IgE 03/16/2021      Pigweed, Common IgE 03/16/2021 <0.35     English Plantain IgE 03/16/2021      Giant Ragweed IgE 03/16/2021 <0.35     German Cockroach IgE 03/16/2021 <0.35     Sheep Sorrel IgE 03/16/2021 <0.35     Ragweed, short (common) * 03/16/2021 <0.35     Folts Ash Tree IgE 03/16/2021 <0.35     [place] Clines (American)  tree IgE 03/16/2021 <0.35     Birch (Common Silver) tr* 03/16/2021 <0.35     Box Elder Tree IgE 03/16/2021 <0.35     Elm Tree IgE 03/16/2021 <0.35     Maple Leaf Sycamore IgE 03/16/2021 <0.35     [place] Oak Tree IgE 03/16/2021 <0.35     Pecan Hickory IgE 03/16/2021 <0.35     Walnut Tree IgE 03/16/2021 <0.35     [place] Grass IgE 03/16/2021 <0.35     Goosefoot (Lamb's Quarte* 03/16/2021 <0.35     Willow tree IgE 03/16/2021 <0.35

## 2021-07-04 NOTE — Unmapped (Signed)
-  continue Dupixent, Symbicort as needed as prescribed by pulmonology    -if you are interested in allergy shots in the future, we can bring you back for skin testing and potentially starting you on shots if your runny nose symptoms or rhinitis worsen    -continue nasal sprays as prescribed by ENT

## 2021-07-08 NOTE — Unmapped (Signed)
[facility] Refill Coordination Note    Specialty Medication(s) to be Shipped:   Inflammatory Disorders: Dupixent    Other medication(s) to be shipped: No additional medications requested for fill at this time     Rosabell Geyer, DOB: April 04, 1950  Phone: [phone] (home)       All above HIPAA information was verified with patient.     Was a Nurse, learning disability used for this call? No    Completed refill call assessment today to schedule patient's medication shipment from the [facility] [phone]).  All relevant notes have been reviewed.     Specialty medication(s) and dose(s) confirmed: Regimen is correct and unchanged.   Changes to medications: Shaniah reports no changes at this time.  Changes to insurance: No  New side effects reported not previously addressed with a pharmacist or physician: None reported  Questions for the pharmacist: No    Confirmed patient received a Conservation officer, historic buildings and a Surveyor, mining with first shipment. The patient will receive a drug information handout for each medication shipped and additional FDA Medication Guides as required.       DISEASE/MEDICATION-SPECIFIC INFORMATION        For patients on injectable medications: Patient currently has 1 doses left.  Next injection is scheduled for 07/08/21.    SPECIALTY MEDICATION ADHERENCE     Medication Adherence    Patient reported X missed doses in the last month: 0  Specialty Medication: Dupixent 300mg /56ml Inj Pen  Patient is on additional specialty medications: No  Patient is on more than two specialty medications: No  Informant: patient  Reliability of informant: reliable  Reasons for non-adherence: no problems identified              Were doses missed due to medication being on hold? No    Dupixent 300/2 mg/ml: 0 days of medicine on hand        REFERRAL TO PHARMACIST     Referral to the pharmacist: Not needed      [facility]     Shipping address confirmed in Epic.     Delivery Scheduled: Yes, Expected medication delivery date: 07/15/21.     Medication will be delivered via Same Day Courier to the prescription address in Epic WAM.    [name]   [facility] Specialty Technician

## 2021-07-15 MED FILL — DUPIXENT 300 MG/2 ML SUBCUTANEOUS PEN INJECTOR: SUBCUTANEOUS | 28 days supply | Qty: 4 | Fill #3

## 2021-08-04 NOTE — Unmapped (Signed)
[facility] Refill Coordination Note    Specialty Medication(s) to be Shipped:   CF/Pulmonary/Asthma: Dupixent  Other medication(s) to be shipped: No additional medications requested for fill at this time     Jasmine Carson, DOB: 1950-05-04  Phone: [phone] (home)     All above HIPAA information was verified with patient.     Was a Nurse, learning disability used for this call? No    Completed refill call assessment today to schedule patient's medication shipment from the [facility] [phone]).  All relevant notes have been reviewed.     Specialty medication(s) and dose(s) confirmed: Regimen is correct and unchanged.   Changes to medications: [place] reports no changes at this time.  Changes to insurance: No  New side effects reported not previously addressed with a pharmacist or physician: None reported  Questions for the pharmacist: No    Confirmed patient received a Conservation officer, historic buildings and a Surveyor, mining with first shipment. The patient will receive a drug information handout for each medication shipped and additional FDA Medication Guides as required.       DISEASE/MEDICATION-SPECIFIC INFORMATION        For CF patients: CF [facility] Active? No-not enrolled    SPECIALTY MEDICATION ADHERENCE     Medication Adherence    Patient reported X missed doses in the last month: 0  Specialty Medication: Dupixent 300mg /68ml Inj Pen  Patient is on additional specialty medications: No  Patient is on more than two specialty medications: No  Informant: patient  Reliability of informant: reliable  Reasons for non-adherence: no problems identified        Were doses missed due to medication being on hold? No    Dupixent 300mg /39ml Inj Pen: 1 days of medicine on hand (Next Injection: 08/05/21 & 08/19/21)    REFERRAL TO PHARMACIST     Referral to the pharmacist: Not needed    [facility]     Shipping address confirmed in Epic.     Delivery Scheduled: Yes, Expected medication delivery date: 08/16/2021.     Medication will be delivered via Same Day Courier to the prescription address in Epic WAM.    Sundeep Cary P Allena Katz   [facility] Specialty Technician

## 2021-08-16 MED FILL — DUPIXENT 300 MG/2 ML SUBCUTANEOUS PEN INJECTOR: SUBCUTANEOUS | 28 days supply | Qty: 4 | Fill #4

## 2021-09-07 NOTE — Unmapped (Signed)
[facility] Clinical Assessment & Refill Coordination Note    Jasmine Carson, DOB: [birth year]  Phone: [phone] (home)     All above HIPAA information was verified with patient.     Was a Nurse, learning disability used for this call? No    Specialty Medication(s):   Inflammatory Disorders: Dupixent     Current Outpatient Medications   Medication Sig Dispense Refill    ACCU-CHEK GUIDE TEST STRIPS Strp       albuterol HFA 90 mcg/actuation inhaler Inhale 2 puffs every six (6) hours as needed for wheezing or shortness of breath. 8 g 0    alendronate (FOSAMAX) 70 MG tablet TAKE 1 TABLET BY MOUTH ONCE A WEEK      amLODIPine (NORVASC) 5 MG tablet Take 1 tablet (5 mg total) by mouth daily.      azelastine (ASTELIN) 137 mcg (0.1 %) nasal spray PLACE 1 SPRAY INTO BOTH NOSTRILS 2 (TWO) TIMES DAILY      budesonide-formoteroL (SYMBICORT) 80-4.5 mcg/actuation inhaler Inhale 2 puffs Two (2) times a day. 10.2 g 11    buPROPion (WELLBUTRIN XL) 150 MG 24 hr tablet Take 1 tablet (150 mg total) by mouth daily.      calcium carbonate-vitamin D3 (CALCIUM 600 WITH VITAMIN D3) 600 mg(1,500mg ) -400 unit cap Take 1 capsule by mouth once daily. 30 each 0    cetirizine (ZYRTEC) 10 MG tablet Take 1 tablet (10 mg total) by mouth daily.      dupilumab (DUPIXENT PEN) 300 mg/2 mL PnIj Inject the contents of 1 pen (300 mg) under the skin every fourteen (14) days. 4 mL 11    empty container Misc Use as directed 1 each 0    EPINEPHrine (EPIPEN) 0.3 mg/0.3 mL injection Inject 0.3 mL (0.3 mg total) into the muscle once as needed for anaphylaxis. 1 each 1    fluticasone propionate (FLONASE) 50 mcg/actuation nasal spray 2 sprays into each nostril two (2) times a day. 32 g= 1 month supply 32 g 11    ipratropium-albuteroL (COMBIVENT) 20-100 mcg/actuation Mist inhaler Use 4 times a day scheduled for 2 weeks, then change to 4 times a day AS NEEDED      losartan (COZAAR) 50 MG tablet Take 0.5 tablets (25 mg total) by mouth in the morning for 20 days. 10 tablet 0    lovastatin (MEVACOR) 40 MG tablet Take 1 tablet (40 mg total) by mouth daily with evening meal.      metFORMIN (GLUCOPHAGE-XR) 500 MG 24 hr tablet TAKE 1 TABLET BY MOUTH EVERY DAY WITH DINNER      metoprolol succinate (TOPROL-XL) 25 MG 24 hr tablet Take 1 tablet (25 mg total) by mouth in the morning.      montelukast (SINGULAIR) 10 mg tablet Take 1 tablet (10 mg total) by mouth.      multivitamin-Ca-iron-minerals Tab Take 1 tablet by mouth daily.      omeprazole (PRILOSEC) 20 MG capsule Take 1 capsule (20 mg total) by mouth in the morning.      sodium chloride (OCEAN) 0.65 % nasal spray 2 sprays into each nostril four (4) times a day as needed for congestion (Sinus dryness, congestion). Do not use within 2 hours of Flonase. (Patient not taking: Reported on 07/04/2021) 15 mL 0    traZODone (DESYREL) 50 MG tablet  (Patient not taking: Reported on 07/04/2021)      triamcinolone (KENALOG) 0.1 % cream APPLY THIN FILM TOPICALLY 2 TIMES DAILY AS NEEDED TO ECZEMA  LIKE SKIN RASH (Patient not taking: Reported on 07/04/2021)      triamcinolone (KENALOG) 0.1 % ointment Apply topically Two (2) times a day. 80 g 0     No current facility-administered medications for this visit.        Changes to medications: Hally reports no changes at this time.    Allergies   Allergen Reactions    Sulfamethoxazole      Other reaction(s): RASH    Azithromycin Rash    Zolpidem Other (See Comments)     sleepwalking  Other reaction(s): Other (See Comments)  sleepwalking  Other reaction(s): Other (See Comments)  sleepwalking      Amoxicillin Rash    Atorvastatin Nausea Only       Changes to allergies: No    SPECIALTY MEDICATION ADHERENCE     Dupixent 300  mg/59mL : 0 days of medicine on hand     Medication Adherence    Patient reported X missed doses in the last month: 0  Specialty Medication: Dupixent 300 mg/92mL - every 14 days  Patient is on additional specialty medications: No  Patient is on more than two specialty medications: No  Any gaps in refill history greater than 2 weeks in the last 3 months: no  Demonstrates understanding of importance of adherence: yes  Informant: patient          Specialty medication(s) dose(s) confirmed: Regimen is correct and unchanged.     Are there any concerns with adherence? No    Adherence counseling provided? Not needed    CLINICAL MANAGEMENT AND INTERVENTION      Clinical Benefit Assessment:    Do you feel the medicine is effective or helping your condition? Yes    Clinical Benefit counseling provided? Progress note from 4/17 shows evidence of clinical benefit    Adverse Effects Assessment:    Are you experiencing any side effects? No    Are you experiencing difficulty administering your medicine? No    Quality of Life Assessment:    Quality of Life    Rheumatology  Oncology  Dermatology  Cystic Fibrosis          How many days over the past month did your atopic dermatitis  keep you from your normal activities? For example, brushing your teeth or getting up in the morning. 0 - Jasmine Carson states she has only needed to use steroid cream once since starting Dupixent    Have you discussed this with your provider? Not needed    Acute Infection Status:    Acute infections noted within Epic:  No active infections  Patient reported infection: None    Therapy Appropriateness:    Is therapy appropriate and patient progressing towards therapeutic goals? Yes, therapy is appropriate and should be continued    DISEASE/MEDICATION-SPECIFIC INFORMATION      For patients on injectable medications: Patient currently has 0 doses left.  Next injection is scheduled for 6/30.    PATIENT SPECIFIC NEEDS     Does the patient have any physical, cognitive, or cultural barriers? No    Is the patient high risk? No    Does the patient require a Care Management Plan? No     SOCIAL DETERMINANTS OF HEALTH     At the [facility], we have learned that life circumstances - like trouble affording food, housing, utilities, or transportation can affect the health of many of our patients.   That is why we wanted to ask: are you currently  experiencing any life circumstances that are negatively impacting your health and/or quality of life? No    Social Determinants of Psychologist, prison and probation services Strain: Low Risk     Difficulty of Paying Living Expenses: Not hard at all   Internet Connectivity: Not on file   Food Insecurity: No Food Insecurity    Worried About Programme researcher, broadcasting/film/video in the Last Year: Never true    Barista in the Last Year: Never true   Tobacco Use: Medium Risk    Smoking Tobacco Use: Former    Smokeless Tobacco Use: Former    Passive Exposure: Current   Housing/Utilities: Low Risk     Within the past 12 months, have you ever stayed: outside, in a car, in a tent, in an overnight shelter, or temporarily in someone else's home (i.e. couch-surfing)?: No    Are you worried about losing your housing?: No    Within the past 12 months, have you been unable to get utilities (heat, electricity) when it was really needed?: No   Alcohol Use: Not on file   Transportation Needs: No Transportation Needs    Lack of Transportation (Medical): No    Lack of Transportation (Non-Medical): No   Substance Use: Not on file   Health Literacy: Not on file   Physical Activity: Not on file   Interpersonal Safety: Not on file   Stress: Not on file   Intimate Partner Violence: Not on file   Depression: Not on file   Social Connections: Not on file       Would you be willing to receive help with any of the needs that you have identified today? Not applicable       SHIPPING     Specialty Medication(s) to be Shipped:   Inflammatory Disorders: Dupixent    Other medication(s) to be shipped: No additional medications requested for fill at this time     Changes to insurance: No    Delivery Scheduled: Yes, Expected medication delivery date: 09/13/21.     Medication will be delivered via Same Day Courier to the confirmed prescription address in [facility].    The patient will receive a drug information handout for each medication shipped and additional FDA Medication Guides as required.  Verified that patient has previously received a Conservation officer, historic buildings and a Surveyor, mining.    The patient or caregiver noted above participated in the development of this care plan and knows that they can request review of or adjustments to the care plan at any time.      All of the patient's questions and concerns have been addressed.    Oliva Bustard   [facility] Specialty Pharmacist

## 2021-09-13 DIAGNOSIS — Z889 Allergy status to unspecified drugs, medicaments and biological substances status: Principal | ICD-10-CM

## 2021-09-13 DIAGNOSIS — L209 Atopic dermatitis, unspecified: Principal | ICD-10-CM

## 2021-09-13 DIAGNOSIS — J4521 Mild intermittent asthma with (acute) exacerbation: Principal | ICD-10-CM

## 2021-09-13 MED FILL — DUPIXENT 300 MG/2 ML SUBCUTANEOUS PEN INJECTOR: SUBCUTANEOUS | 28 days supply | Qty: 4 | Fill #5

## 2021-09-27 ENCOUNTER — Ambulatory Visit: Admit: 2021-09-27 | Discharge: 2021-09-28 | Payer: MEDICARE

## 2021-09-27 DIAGNOSIS — J302 Other seasonal allergic rhinitis: Principal | ICD-10-CM

## 2021-09-27 DIAGNOSIS — J4521 Mild intermittent asthma with (acute) exacerbation: Principal | ICD-10-CM

## 2021-09-27 DIAGNOSIS — R911 Solitary pulmonary nodule: Principal | ICD-10-CM

## 2021-09-27 NOTE — Unmapped (Signed)
Addended by: Dario Guardian on: 09/27/2021 11:36 AM     Modules accepted: Level of Service

## 2021-09-27 NOTE — Unmapped (Signed)
-----   Message from [place] sent at 09/27/2021  4:15 PM EDT -----  Regarding: RE: Please schedule follow up appt  I have been unable to reach this pt, but have left a vm to call us back to schedule an appt. I have sent a well text, as well as created an encounter if she calls back. Thanks  ----- Message -----  From: Ellin Mayhew, MD  Sent: 09/27/2021   3:50 PM EDT  To: [facility] Front Desk Staff  Subject: Please schedule follow up appt                   Hello!    Could you please schedule this patient in follow up with me in 4-6 weeks with Flow volume loop pre/post? She should have her follow up CT chest for pulmonary nodule completed prior to her visit (also ordered).     Thank you!    Sherryll Burger E. Talmadge Coventry, MD  Pulmonary & Critical Care Fellow  Pager: [phone]  Cell: [phone]

## 2021-09-27 NOTE — Unmapped (Signed)
ESTABLISHED PATIENT RHINOLOGY VISIT    SUBJECTIVE:    Reason for visit:  Jasmine Carson is seen in follow-up.    HPI:   Jasmine Carson is a 71 y.o. female who returns today for evaluation of chronic sinonasal symptoms.     Interval Update 09/27/2021: on dupixent for atopic dermatitis for last 4 months. Recent in-vitro allergy testing revealed sensitivity to alternaria. Today she reports overall doing generally well from a sinonasal standpoint.  She is not irrigating as frequently as before.  She also notes that she has improved significantly from a reactive airways disease since being on Dupixent.  She has not required antibiotics for sinus infections.    Interval Update 03/30/2021: The patient returns to clinic today for follow-up. She is doing the nasal rinses and nasal sprays. She reports she has not had any sinus infections since Jasmine Carson last visit. She endorses aural fullness. She notes she hasn't needed to use Jasmine Carson inhaler in 4 weeks.     The patient's history includes: symptoms beginning many years ago and has gotten progressively worse particularly in the last year. She notes 1-2 sinus infections per year typically but in the last year has had 3-4 sinus infections requiring 2 rounds of antibiotics. She notes improvement in Jasmine Carson symptoms following antibiotics but not complete resolution. She was recently seen in the ED for wheezing and cough and prescribed inhalers and notes improvement. She has a history of GERD and takes omperazole in the morning. She notes increased congestion and drainage in the morning when waking. She uses flonase BID and has used flonse for years consistently. The last time She was prescribed antibiotics was augmentin x 7 days in July 2022, which did help with the sinus symptoms.    The patient's medical record has been thoroughly reviewed and pertinent details include: Per recent ED visit:   history of HTN, CKD, GERD, and seasonal allergies who presents emergency department today for evaluation of 6 months of cough, 1 week of wheezing and shortness of breath on exertion, and 1 day of worsening wheezing and dyspnea not at rest. Recently given prednisone but no abx.    Per PCP:  Seasonal allergies     Last Assessment & Plan:     Formatting of this note might be different from the original.  Persistent  - Add Singulair 10 mg po Q HS  - Continue Astelin nasal spray daily     The patient also denies dysphagia, odynophagia, hemoptysis, weight loss, acute vision changes, paresthesias, nuchal rigidity, or any other constitutional symptoms.    Allergy testing: yes; 30+ years ago On immunotherapy: no; never      Past Medical/Surgical History  She  has a past medical history of Carpal tunnel syndrome, CKD (chronic kidney disease), Eczema, Gastroesophageal reflux, Hypertension, Seasonal allergies, and Type 2 diabetes mellitus without complication, without long-term current use of insulin (CMS-HCC) (07/26/2021).  Jasmine Carson  has a past surgical history that includes Hysterectomy and Carpal tunnel release (Right).    Past Family/Social History  Jasmine Carson family history is not on file.  She  reports that she has quit smoking. Jasmine Carson smoking use included cigarettes. She has been exposed to tobacco smoke. She has quit using smokeless tobacco.  Jasmine Carson smokeless tobacco use included snuff. She reports current alcohol use of about 2.0 standard drinks per week. She reports that she does not use drugs.    Medications/Allergies/Immunizations  Jasmine Carson current medication(s) include:    Current Outpatient Medications on File Prior to Visit  Medication Sig    ACCU-CHEK GUIDE TEST STRIPS Strp     albuterol HFA 90 mcg/actuation inhaler Inhale 2 puffs every six (6) hours as needed for wheezing or shortness of breath.    alendronate (FOSAMAX) 70 MG tablet TAKE 1 TABLET BY MOUTH ONCE A WEEK    amLODIPine (NORVASC) 5 MG tablet Take 1 tablet (5 mg total) by mouth daily.    azelastine (ASTELIN) 137 mcg (0.1 %) nasal spray PLACE 1 SPRAY INTO BOTH NOSTRILS 2 (TWO) TIMES DAILY    budesonide-formoteroL (SYMBICORT) 80-4.5 mcg/actuation inhaler Inhale 2 puffs Two (2) times a day.    buPROPion (WELLBUTRIN XL) 150 MG 24 hr tablet Take 1 tablet (150 mg total) by mouth daily.    calcium carbonate-vitamin D3 (CALCIUM 600 WITH VITAMIN D3) 600 mg(1,500mg ) -400 unit cap Take 1 capsule by mouth once daily.    cetirizine (ZYRTEC) 10 MG tablet Take 1 tablet (10 mg total) by mouth daily.    dupilumab (DUPIXENT PEN) 300 mg/2 mL PnIj Inject the contents of 1 pen (300 mg) under the skin every fourteen (14) days.    empty container Misc Use as directed    EPINEPHrine (EPIPEN) 0.3 mg/0.3 mL injection Inject 0.3 mL (0.3 mg total) into the muscle once as needed for anaphylaxis.    fluticasone propionate (FLONASE) 50 mcg/actuation nasal spray 2 sprays into each nostril two (2) times a day. 32 g= 1 month supply    ipratropium-albuteroL (COMBIVENT) 20-100 mcg/actuation Mist inhaler Use 4 times a day scheduled for 2 weeks, then change to 4 times a day AS NEEDED    lovastatin (MEVACOR) 40 MG tablet Take 1 tablet (40 mg total) by mouth daily with evening meal.    metFORMIN (GLUCOPHAGE-XR) 500 MG 24 hr tablet TAKE 1 TABLET BY MOUTH EVERY DAY WITH DINNER    metoprolol succinate (TOPROL-XL) 25 MG 24 hr tablet Take 1 tablet (25 mg total) by mouth in the morning.    montelukast (SINGULAIR) 10 mg tablet Take 1 tablet (10 mg total) by mouth.    multivitamin-Ca-iron-minerals Tab Take 1 tablet by mouth daily.    omeprazole (PRILOSEC) 20 MG capsule Take 1 capsule (20 mg total) by mouth in the morning.    sodium chloride (OCEAN) 0.65 % nasal spray 2 sprays into each nostril four (4) times a day as needed for congestion (Sinus dryness, congestion). Do not use within 2 hours of Flonase.    traZODone (DESYREL) 50 MG tablet     triamcinolone (KENALOG) 0.1 % cream     triamcinolone (KENALOG) 0.1 % ointment Apply topically Two (2) times a day.    losartan (COZAAR) 50 MG tablet Take 0.5 tablets (25 mg total) by mouth in the morning for 20 days.    [DISCONTINUED] lisinopriL-hydrochlorothiazide (PRINZIDE,ZESTORETIC) 20-12.5 mg per tablet Take 1 tablet by mouth in the morning.     No current facility-administered medications on file prior to visit.     Allergies: Sulfamethoxazole, Azithromycin, Zolpidem, Amoxicillin, and Atorvastatin,  Immunizations:   Immunization History   Administered Date(s) Administered    COVID-19 VAC,BIVALENT,MODERNA(BLUE CAP) 03/16/2021    COVID-19 VACCINE,MRNA(MODERNA)(PF) 04/14/2019, 05/12/2019        Review of Systems:  As above and on the new patients intake form, otherwise the balance of 11 systems was negative.  Jasmine Carson RSDI score was not obtained, was previously 11, and is documented in the chart.  SNOT 22 score was 20 (16).  SINO-NASAL OUTCOME TEST (SNOT-22)   Need to blow nose: Mild  or slight problem  Nasal Blockage: Mild or slight problem  Sneezing: Very mild problem  Runny nose: Moderate problem  Cough: Very mild problem  Post nasal discharge: Very mild problem  Thick nasal discharge: Very mild problem  Ear fullness: Mild or slight problem  Dizziness: Very mild problem  Ear pain: Very mild problem  Facial pain/pressure: Very mild problem  Decreased Sense of Smell/Taste: No problem  Difficulty falling asleep: Very mild problem  Wake up at night: Very mild problem  Lack of good night's sleep: Very mild problem  Wake up tired: Very mild problem  Fatigue: No problem  Reduced productivity: No problem  Reduced concentration: No problem  Frustrated/restless/irritable: No problem  Sad: No problem  Embarrassed: No problem  Snot-22 Total Score: 20SINO-NASAL OUTCOME TEST (SNOT-22)   Need to blow nose: Mild or slight problem  Nasal Blockage: Mild or slight problem  Sneezing: Very mild problem  Runny nose: Moderate problem  Cough: Very mild problem  Post nasal discharge: Very mild problem  Thick nasal discharge: Very mild problem  Ear fullness: Mild or slight problem  Dizziness: Very mild problem  Ear pain: Very mild problem  Facial pain/pressure: Very mild problem  Decreased Sense of Smell/Taste: No problem  Difficulty falling asleep: Very mild problem  Wake up at night: Very mild problem  Lack of good night's sleep: Very mild problem  Wake up tired: Very mild problem  Fatigue: No problem  Reduced productivity: No problem  Reduced concentration: No problem  Frustrated/restless/irritable: No problem  Sad: No problem  Embarrassed: No problem  Snot-22 Total Score: 20    OBJECTIVE:    Physical Exam  Vital Signs:  BP 144/90  - Pulse 85  - Temp 36.3 ??C (97.4 ??F)  - Resp 18  - Ht 162.6 cm (5' 4)  - Wt 69.4 kg (153 lb)  - BMI 26.26 kg/m??   General:  Well-developed, well-nourished  Communication and Voice:  Clear pitch and clarity  Respiratory  Respiratory effort:  Equal inspiration and expiration without stridor  External nose:  There are neither lesions nor asymmetry of the nasal tip/ dorsum.  Internal Nose:  On anterior rhinoscopy, visualization posteriorly is limited on anterior examination. For this reason, to adequately evaluate posteriorly for masses, polypoid disease and/or signs of infections, nasal endoscopy is indicated. (see procedure below)      PROCEDURE NOTE    Procedure: Bilateral Sinonasal Endoscopy (CPT [phone])    Indication: Chronic sinusitis symptoms. On anterior rhinoscopy, visualization posteriorly is limited on anterior examination. For this reason, to adequately evaluate posteriorly for masses, polypoid disease and/or signs of infections, nasal endoscopy is indicated.    Consent: After discussion of the risks of the procedure (primarily pain and bleeding) and obtaining informed verbal consent, a time-out was performed confirming the patient???s name, birthdate, and procedure to be performed.    Surgeon: Aurelio Brash, Montez Hageman., MD, MPH (entire procedure performed by surgeon)    Anesthesia: None    Complications: None    Disposition: Discharged home in stable condition.     Findings:    Nasal Cavity: Mucosa appears with decreased edema with clear mucus  Evidence of previous surgery:  No.: Neither  Septum: Midline.  Inferior turbinates: POSITIVE FOR hypertrophy but improved  RIGHT:   Semilunar Hiatus: Without polyps, Without purulence.   Sphenoethmoid Recess:  Without polyps, Without purulence.   LEFT:   Semilunar Hiatus: Without polyps, Without purulence.   Sphenoethmoid Recess:  Without polyps, Without purulence.  Procedure: The 2.31mm 30 degree and/or 70 degree telescopes were used to perform nasal endoscopy on each side. Findings mentioned above. The telescopes were then withdrawn and the patient tolerated the procedure well.    Oretha Ellis Nasal Endoscopy Score    Left        Polyps:  Absent (0)   Edema:   Mild (1)   Discharge:  Clear, Thin (1)    Scarring:  Absent (0)   Crusting:  None (0)      Total Left:  2     Right         Polyps:  Absent (0)   Edema:  Mild (1)   Discharge: Clear, Thin (1)    Scarring:  Absent (0)   Crusting:  None (0)      Total Right:   2    Diagnostic Studies:     Untreated CT on 10/23/20        MEDICAL DECISION MAKING:  Allergic Rhinitis  Inferior Turbinate Hypertrophy  Posterior Nasal Drainage  Nasal Airway Obstruction  Headache/Facial Pain   No evidence of Sinusitis on Nasal Endoscopy  LPR    She  has a past medical history of Carpal tunnel syndrome, CKD (chronic kidney disease), Eczema, Gastroesophageal reflux, Hypertension, Seasonal allergies, and Type 2 diabetes mellitus without complication, without long-term current use of insulin (CMS-HCC) (07/26/2021).      PLAN:  1. Ms. Oki is doing well and has maintained improvement. We have extensively discussed treatment options. The patient is to continue on a nasal hygiene regimen which includes BID isotonic nasal irrigation and nasal steroid sprays (2 sprays BID each nostril). We have discussed the proper positioning for optimal use of the nasal steroid.     2. We continue to recommend regarding a mouthguard for Jasmine Carson TMJ to reduce the frequency of headaches and facial pain.      4. Follow-up in 8-12 months, or sooner as needed.     Note - This record has been created using [employer]. Chart creation errors have been sought, but may not always have been located. Such creation errors do not reflect on the standard of medical care.

## 2021-09-27 NOTE — Unmapped (Signed)
[facility]  Otolaryngology- Head and Neck Surgery Patient Instruction Sheet      BUFFERED ISOTONIC SALINE NASAL IRRIGATION     The Benefits:    1. When you irrigate, the isotonic saline (salt water) acts as a solvent and washes the mucus crusts and other debris from your nose.    2. This decongests and improves the airflow into your nose.  The sinus passages begin to open.    3. Studies have also shown that a salt water and an alkaline (baking soda) irrigation solution improves nasal membrane cell function (mucociliary flow of mucus debris).    The Recipe:    1. Choose a 1-quart glass jar that is thoroughly cleansed.    2. Fill with sterile or distilled water, or you can boil water from the tap.    3. Add 1 to 2 heaping teaspoons of pickling/canning/sea salt (NOT table salt as it contains a large number of additives).  This salt is available at the grocery store in the food canning section.    4. Add 1 teaspoon of [employer] Hammer Baking Soda (pure bicarbonate).    5. Mix ingredients together and store at room temperature.  Discard after one week.  If you find this solution too strong, you may decrease the amount of salt added to 1 to 1 ?? teaspoons.  With children it is often best to start with a milder solution and advance slowly.  Irrigate with 240 ml (8 oz) twice daily.    The Instructions:    You should plan to irrigate your nose with buffered isotonic saline 2 times per day.  Many people prefer to warm the solution slightly in the microwave - but be sure that the solution is NOT HOT.  Stand over the sink (some do this in the shower) and squirt the solution into each side of your nose, keeping your mouth open. This allows you to spit the saltwater out of your mouth.  It will not harm you if you swallow a little.    If you have been told to use a nasal steroid such as Flonase, Nasonex, or Nasacort, you should always use isortonic saline solution first, then use your nasal steroid product.  The nasal steroid is much more effective when sprayed onto clean nasal membranes and the steroid medicine will reach deeper into the nose.    Most people experience a little burning sensation the first few times they use a isotonic saline solution, but this usually goes away within a few days.          NASAL STEROID USE INSTRUCTIONS    Step 1. Prepare the nose. Blow the nose before administering the drug.    Step 2. Prime and activate the delivery device as recommended by the manufacturer.    Step 3. Position the head by tilting the head forward.    Step 4. Insert the tip of the applicator gently, avoiding contact with the septum.    Step 5. Aim the applicator tip about 45?? from the floor of the nose and direct it at the outer corner of the eye on the same side to avoid traumatizing or spraying the septum.    Step 6. Close the other nostril gently with a finger.     Step 7. Sniff or inhale gently while delivering the drug.

## 2021-10-03 NOTE — Unmapped (Signed)
[facility] Refill Coordination Note    Specialty Medication(s) to be Shipped:   CF/Pulmonary/Asthma: Dupixent 300mg /3ml    Other medication(s) to be shipped: No additional medications requested for fill at this time     Jasmine Carson, DOB: 12/21/1950  Phone: [phone] (home)       All above HIPAA information was verified with patient.     Was a Nurse, learning disability used for this call? No    Completed refill call assessment today to schedule patient's medication shipment from the [facility] [phone]).  All relevant notes have been reviewed.     Specialty medication(s) and dose(s) confirmed: Regimen is correct and unchanged.   Changes to medications: Tyreshia reports no changes at this time.  Changes to insurance: No  New side effects reported not previously addressed with a pharmacist or physician: None reported  Questions for the pharmacist: No    Confirmed patient received a Conservation officer, historic buildings and a Surveyor, mining with first shipment. The patient will receive a drug information handout for each medication shipped and additional FDA Medication Guides as required.       DISEASE/MEDICATION-SPECIFIC INFORMATION        For patients on injectable medications: Patient currently has 0 doses left.  Next injection is scheduled for 10/14/21.    SPECIALTY MEDICATION ADHERENCE     Medication Adherence    Patient reported X missed doses in the last month: 0  Specialty Medication: DUPIXENT PEN 300 mg/2 mL Pnij (dupilumab)  Patient is on additional specialty medications: No  Patient is on more than two specialty medications: No  Informant: patient  Reliability of informant: reliable  Provider-estimated medication adherence level: good  Reasons for non-adherence: no problems identified              Were doses missed due to medication being on hold? No    DUPIXENT PEN 300 mg/2 mL Pnij (dupilumab)  : 0 days of medicine on hand        REFERRAL TO PHARMACIST     Referral to the pharmacist: Not needed      [facility]     Shipping address confirmed in Epic.     Delivery Scheduled: Yes, Expected medication delivery date: 10/11/21.     Medication will be delivered via Same Day Courier to the prescription address in Epic WAM.    Jasmine Carson' [facility] Specialty Technician

## 2021-10-10 ENCOUNTER — Ambulatory Visit: Admit: 2021-10-10 | Discharge: 2021-10-11 | Payer: MEDICARE

## 2021-10-10 ENCOUNTER — Ambulatory Visit (INDEPENDENT_AMBULATORY_CARE_PROVIDER_SITE_OTHER): Payer: Medicare Other | Admitting: Dermatology

## 2021-10-10 DIAGNOSIS — Z79899 Other long term (current) drug therapy: Secondary | ICD-10-CM

## 2021-10-10 DIAGNOSIS — L308 Other specified dermatitis: Secondary | ICD-10-CM

## 2021-10-10 DIAGNOSIS — L649 Androgenic alopecia, unspecified: Secondary | ICD-10-CM

## 2021-10-10 MED ORDER — MINOXIDIL 2.5 MG PO TABS: 2.5000 mg | ORAL_TABLET | Freq: Every day | ORAL | 2 refills | Status: DC

## 2021-10-10 NOTE — Patient Instructions (Addendum)
Start minoxidil 2.5 mg taking 1/2 tablet daily.   Doses of minoxidil for hair loss are considered 'low dose'. This is because the doses used for hair loss are a lot lower than the doses which are used for conditions such as high blood pressure (hypertension). The doses used for hypertension are 10-40mg  per day.  Side effects are uncommon at the low doses (up to 2.5 mg/day) used to treat hair loss. Potential side effects, more commonly seen at higher doses, include: Increase in hair growth (hypertrichosis) elsewhere on face and body Temporary hair shedding upon starting medication which may last up to 4 weeks Ankle swelling, fluid retention, rapid weight gain more than 5 pounds Low blood pressure and feeling lightheaded or dizzy when standing up quickly Fast or irregular heartbeat Headaches  Continue Dupixent as prescribed by pulmonologist Continue triamcinolone 0.1 % cream 1-2 times daily as needed for flares. Avoid applying to face, groin, and axilla. Use as directed. Long-term use can cause thinning of the skin.  Dupilumab (Dupixent) is a treatment given by injection for adults and children with moderate-to-severe atopic dermatitis. Goal is control of skin condition, not cure. It is given as 2 injections at the first dose followed by 1 injection ever 2 weeks thereafter.  Young children are dosed monthly.  Potential side effects include allergic reaction, herpes infections, injection site reactions and conjunctivitis (inflammation of the eyes).  The use of Dupixent requires long term medication management, including periodic office visits.  Topical steroids (such as triamcinolone, fluocinolone, fluocinonide, mometasone, clobetasol, halobetasol, betamethasone, hydrocortisone) can cause thinning and lightening of the skin if they are used for too long in the same area. Your physician has selected the right strength medicine for your problem and area affected on the body. Please use your medication  only as directed by your physician to prevent side effects.   Due to recent changes in healthcare laws, you may see results of your pathology and/or laboratory studies on MyChart before the doctors have had a chance to review them. We understand that in some cases there may be results that are confusing or concerning to you. Please understand that not all results are received at the same time and often the doctors may need to interpret multiple results in order to provide you with the best plan of care or course of treatment. Therefore, we ask that you please give [place] 2 business days to thoroughly review all your results before contacting the office for clarification. Should we see a critical lab result, you will be contacted sooner.   If You Need Anything After Your Visit  If you have any questions or concerns for your doctor, please call our main line at [phone] and press option 4 to reach your doctor's medical assistant. If no one answers, please leave a voicemail as directed and we will return your call as soon as possible. Messages left after 4 pm will be answered the following business day.   You may also send [place] a message via MyChart. We typically respond to MyChart messages within 1-2 business days.  For prescription refills, please ask your pharmacy to contact our office. Our fax number is [phone].  If you have an urgent issue when the clinic is closed that cannot wait until the next business day, you can page your doctor at the number below.    Please note that while we do our best to be available for urgent issues outside of office hours, we are not available 24/7.  If you have an urgent issue and are unable to reach [place], you may choose to seek medical care at your doctor's office, retail clinic, urgent care center, or emergency room.  If you have a medical emergency, please immediately call 911 or go to the emergency department.  Pager Numbers  - Dr. Gwen Pounds:  [phone]  - Dr. Neale Burly: [phone]  - Dr. Roseanne Reno: [phone]  In the event of inclement weather, please call our main line at [phone] for an update on the status of any delays or closures.  Dermatology Medication Tips: Please keep the boxes that topical medications come in in order to help keep track of the instructions about where and how to use these. Pharmacies typically print the medication instructions only on the boxes and not directly on the medication tubes.   If your medication is too expensive, please contact our office at [phone] option 4 or send [place] a message through MyChart.   We are unable to tell what your co-pay for medications will be in advance as this is different depending on your insurance coverage. However, we may be able to find a substitute medication at lower cost or fill out paperwork to get insurance to cover a needed medication.   If a prior authorization is required to get your medication covered by your insurance company, please allow [place] 1-2 business days to complete this process.  Drug prices often vary depending on where the prescription is filled and some pharmacies may offer cheaper prices.  The website www.goodrx.com contains coupons for medications through different pharmacies. The prices here do not account for what the cost may be with help from insurance (it may be cheaper with your insurance), but the website can give you the price if you did not use any insurance.  - You can print the associated coupon and take it with your prescription to the pharmacy.  - You may also stop by our office during regular business hours and pick up a GoodRx coupon card.  - If you need your prescription sent electronically to a different pharmacy, notify our office through [facility] or by phone at [phone] option 4.     Si Usted Necesita Algo Despus de Su Visita  Tambin puede enviarnos un mensaje a travs de Clinical cytogeneticist. Por lo general  respondemos a los mensajes de MyChart en el transcurso de 1 a 2 das hbiles.  Para renovar recetas, por favor pida a su farmacia que se ponga en contacto con nuestra oficina. Annie Sable de fax es [place] [phone].  Si tiene un asunto urgente cuando la clnica est cerrada y que no puede esperar hasta el siguiente da hbil, puede llamar/localizar a su doctor(a) al nmero que aparece a continuacin.   Por favor, tenga en cuenta que aunque hacemos todo lo posible para estar disponibles para asuntos urgentes fuera del horario de [place], no estamos disponibles las 24 horas del da, los 7 [address]  [address] de la [place].   Si tiene un problema urgente y no puede comunicarse con nosotros, puede optar por buscar atencin mdica  en el consultorio de su doctor(a), en una clnica privada, en un centro de atencin urgente o en una sala de emergencias.  Si tiene Engineer, drilling, por favor llame inmediatamente al 911 o vaya a la sala de emergencias.  Nmeros de bper  - Dr. Gwen Pounds: [phone]  - Dra. Moye: [phone]  - Dra. Roseanne Reno: [phone]  En caso de inclemencias del tiempo, por favor llame a nuestra lnea principal  al [phone] para una actualizacin sobre el [place] de cualquier retraso o cierre.  Consejos para la medicacin en dermatologa: Por favor, guarde las cajas en las que vienen los medicamentos de uso tpico para ayudarle a seguir las instrucciones sobre dnde y cmo usarlos. Las farmacias generalmente imprimen las instrucciones del medicamento slo en las cajas y no directamente en los tubos del [place].   Si su medicamento es muy caro, por favor, pngase en contacto con Zigmund Daniel llamando al [phone] y presione la opcin 4 o envenos un mensaje a travs de Pharmacist, community.   No podemos decirle cul ser su copago por los medicamentos por adelantado ya que esto es diferente dependiendo de la cobertura de su seguro. Sin embargo, es posible que podamos encontrar un  medicamento sustituto a Electrical engineer un formulario para que el seguro cubra el medicamento que se considera necesario.   Si se requiere una autorizacin previa para que su compaa de seguros [place] su medicamento, por favor permtanos de 1 a 2 das hbiles para completar este proceso.  Los precios de los medicamentos varan con frecuencia dependiendo del Environmental consultant de dnde se surte la receta y alguna farmacias pueden ofrecer precios ms baratos.  El sitio web www.goodrx.com tiene cupones para medicamentos de Airline pilot. Los precios aqu no tienen en cuenta lo que podra costar con la ayuda del seguro (puede ser ms barato con su seguro), pero el sitio web puede darle el precio si no utiliz Research scientist (physical sciences).  - Puede imprimir el cupn correspondiente y llevarlo con su receta a la farmacia.  - Tambin puede pasar por nuestra oficina durante el horario de atencin regular y Charity fundraiser una tarjeta de cupones de GoodRx.  - Si necesita que su receta se enve electrnicamente a una farmacia diferente, informe a nuestra oficina a travs de MyChart de [place] o por telfono llamando al [phone] y presione la opcin 4.

## 2021-10-10 NOTE — Progress Notes (Signed)
Follow-Up Visit   Subjective  Debra Alexander is a 71 y.o. female who presents for the following: Follow-up (Patient here today for 4 month eczema and alopecia follow up. Patient currently on Dupixent and is well controlled. Patient not using anything for hair loss but she feels that she does have some hair growing. ).  Patient's pulmonologist prescribing Dupixent for asthma.   The following portions of the chart were reviewed this encounter and updated as appropriate:       Review of Systems:  No other skin or systemic complaints except as noted in HPI or Assessment and Plan.  Objective  Well appearing patient in no apparent distress; mood and affect are within normal limits.  A focused examination was performed including scalp. Relevant physical exam findings are noted in the Assessment and Plan.  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 Chronic and persistent condition with duration or expected duration over one year. Condition is symptomatic/ bothersome to patient. Improving but not currently at goal.   Telogen effluvium is a benign, self-limited condition causing increased hair shedding usually for several months. It does not progress to baldness, and the hair eventually grows back on its own. It can be triggered by recent illness, recent surgery, thyroid disease, low iron stores, vitamin D deficiency, fad diets or rapid weight loss, hormonal changes such as pregnancy or birth control pills, and some medication. Usually the hair loss starts 2-3 months after the illness or health change. Rarely, it can continue for longer than a year.  Female Androgenic Alopecia is a chronic condition related to genetics  and/or hormonal changes.  In women androgenetic alopecia is commonly associated with menopause but may occur any time after puberty.  It causes hair thinning primarily on the crown with widening of the part and temporal hairline recession.  Can use OTC Rogaine (minoxidil) 5% solution/foam as directed.  Oral treatments in female patients who have no contraindication may include : - Low dose oral minoxidil 1.25 - 5mg  daily - Spironolactone 50 - 100mg  bid - Finasteride 2.5 - 5 mg daily Adjunctive therapies include: - Low Level Laser Light Therapy (LLLT) - Platelet-rich plasma injections (PRP) - Hair Transplants or scalp reduction   Start minoxidil 2.5 mg, taking 1/2 tablet (1.25 mg) daily.  BP 151/98  Doses of minoxidil for hair loss are considered 'low dose'. This is because the doses used for hair loss are a lot lower than the doses which are used for conditions such as high blood pressure (hypertension). The doses used for hypertension are 10-40mg  per day.  Side effects are uncommon at the low doses (up to 2.5 mg/day) used to treat hair loss. Potential side effects, more commonly seen at higher doses, include: Increase in hair growth (hypertrichosis) elsewhere on face and body Temporary hair shedding upon starting medication which may last up to 4 weeks Ankle swelling, fluid retention, rapid weight gain more than 5 pounds Low blood pressure and feeling lightheaded or dizzy when standing up quickly Fast or irregular heartbeat Headaches   minoxidil (LONITEN) 2.5 MG tablet - Scalp Take 1 tablet (2.5 mg total) by mouth daily.  Other eczema hands, legs, arms  Chronic condition with duration or expected  duration over one year. Currently well-controlled.   Atopic dermatitis - Severe, on Dupixent (biologic medication).  Atopic dermatitis (eczema) is a chronic, relapsing, pruritic condition that can significantly affect quality of life. It is often associated with allergic rhinitis and/or  asthma and can require treatment with topical medications, phototherapy, or in severe cases a biologic medication called Dupixent, which requires long term medication management.    Continue Dupixent as prescribed by pulmonologist for asthma Continue TMC 0.1 % cream 1-2 times daily as needed for flares. Avoid applying to face, groin, and axilla. Use as directed. Long-term use can cause thinning of the skin.  Dupilumab (Dupixent) is a treatment given by injection for adults and children with moderate-to-severe atopic dermatitis. Goal is control of skin condition, not cure. It is given as 2 injections at the first dose followed by 1 injection ever 2 weeks thereafter.  Young children are dosed monthly.  Potential side effects include allergic reaction, herpes infections, injection site reactions and conjunctivitis (inflammation of the eyes).  The use of Dupixent requires long term medication management, including periodic office visits.  Topical steroids (such as triamcinolone, fluocinolone, fluocinonide, mometasone, clobetasol, halobetasol, betamethasone, hydrocortisone) can cause thinning and lightening of the skin if they are used for too long in the same area. Your physician has selected the right strength medicine for your problem and area affected on the body. Please use your medication only as directed by your physician to prevent side effects.     Return in about 3 months (around 01/10/2022) for Alopecia, Eczema.  Anise Salvo, RMA, am acting as scribe for Willeen Niece, MD .  Documentation: I have reviewed the above documentation for accuracy and completeness, and I agree with the above.  Willeen Niece MD

## 2021-10-11 MED FILL — DUPIXENT 300 MG/2 ML SUBCUTANEOUS PEN INJECTOR: SUBCUTANEOUS | 28 days supply | Qty: 4 | Fill #6

## 2021-10-13 ENCOUNTER — Ambulatory Visit: Admit: 2021-10-13 | Discharge: 2021-10-14 | Payer: MEDICARE

## 2021-10-25 NOTE — Unmapped (Signed)
[facility]  Pulmonary Clinic - Follow-up Visit    Referring Physician :  [facility]  PCP:     [facility]  Reason for Consult:   F/u dyspnea & reactive airways    HISTORY:     Active Pulmonary Problems & Brief History:  Jasmine Carson is a 71 y.o. female former smoker (quit 1992, 25 pack year hx)  with PMHx hypertension, CKD, GERD and seasonal allergies recently admitted to [facility] for progressive cough, wheezing and dyspnea for 6 months. She was hypoxic in the ED but improved rapidly after steroids and nebs. Seen by the inpatient pulmonary consult team, at that time it was noted:   - Developed dry cough for the last 6 months which has progressively increased. Occ clear sputum. Wheezing prior to hospital admission. Improved with steroids previously. No triggering factors, no change in her cough pattern during the day or based on position. Occasionally coughs so hard that it gives her some pain in her chest and shortness of breath.    - In the last 2 to 3 months, she has had multiple visits to her primary care doctor in urgent care and has received different courses of antibiotics with steroids which make her feel better.  - Generally atopic and develops rash (eczema), sinus congestion, rhinorrhea when exposed to environmental allergens.   - Significant reflux symptoms improved on PPI - cough likewise improved. Her   She smoked for 25 years, 1 pack a day however quit more than 30 years ago.  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  She noted no change in her symptoms while she was working in the [employer].  CT imaging was done in the ED which did not show any PE however showed a subsolid 1 cm nodule in the lingula and no other parenchymal lung disease.    Interval History: Using flonase, singulair & cetirizine currently for allergy symptoms with improvement. GERD symptoms are well controlled on omeprazole.   Feels her shortness of breath and cough have entirely resolved. No wheezing. She used her Breo and combiventl inhalers for a short time after hospital discharge, but did not continue them after her symptoms had resolved.   She does have severe eczema and is waiting to be seen by derm again for this. Still having bad symptoms, drying, itching despite topical steroid.     Interval Hx 03/16/21: Doing well generally. Not needing her rescue inhaler. No exacerbations or PO steroids since our last visit. Breathing sx are worse in spring/fall. Takes daily antihistamine. Has chronic rhino-sinusitis symptoms with post-nasal drip & intermittent cough productive of clear phlegm, particularly in AM. Using flonase but not nasal saline spray. No nocturnal Sx. Eczema is still poorly controlled - although better than when I last saw her. She is using up topical steroid cream more quickly than her prescription allows.     Interval Hx October 24, 2021:  Feeling MUCH better on dupixent. Has weaned symbicort from 2 puffs bid to as needed - rarely requires. No exacerbations of respiratory disease. Sinus disease & allergic dermatitis are also significantly improved.     Attacks in the past year:  PO steroids? None since July 2022  Hospitalizations? None since July 2022    Symptoms:  Daytime Sx: Cough sx 1x per week  Nocturnal Sx: None  Exercise limitation:  None - enjoys walking & listening to an exercise tape 3-4x per week, is remodeling bathroom which requires lots of physical effort - not limited by breathing  Rescue inhaler use: None    Triggers:  Allergies: Y on cetirizine every day  Rhinitis: better, still intermittent drainage, on flonase & sinus rinses  q2wk  GERD: Y, on omeprazole + behav mod     Host co morbidities:  OSA: N  Obesity: N    Medications for asthma:  Controller: Symbicort + dupixent  Rescue: combivent + symbicort  Inhaler Technique: good   Affordability: adequate    Action plan:    Immunizations?   Immunization History   Administered Date(s) Administered    COVID-19 VAC,BIVALENT,MODERNA(BLUE CAP) 03/16/2021, 10/26/2021    COVID-19 VACCINE,MRNA(MODERNA)(PF) 04/14/2019, 05/12/2019, 03/11/2020    INFLUENZA QUAD HIGH DOSE 51YRS+(FLUZONE) 12/04/2018, 12/27/2020    Influenza Vaccine Quad (IIV4 PF) 82mo+ injectable 01/12/2015, 12/23/2019    Influenza Virus Vaccine, unspecified formulation 12/20/2010, 01/18/2012, 01/12/2015, 01/07/2018    PNEUMOCOCCAL POLYSACCHARIDE 23 03/14/2017    Pneumococcal Conjugate 13-Valent 12/14/2018, 12/21/2018    TdaP 12/23/2019    ZOSTAVAX - ZOSTER VACCINE, LIVE, SQ 08/18/2013     Past Medical History:   Diagnosis Date    Carpal tunnel syndrome     CKD (chronic kidney disease)     Eczema     Gastroesophageal reflux     Hypertension     Seasonal allergies     Type 2 diabetes mellitus without complication, without long-term current use of insulin (CMS-HCC) 07/26/2021     Past Surgical History:   Procedure Laterality Date    CARPAL TUNNEL RELEASE Right     HYSTERECTOMY         Other History:  The social history and family history were personally reviewed and updated in the patient's electronic medical record.     Home Medications:  Albuterol  Fosamax  Bupropion  flonase  duonebs  Metoprolol  Montelukast  Omeprazole  Saline spray  HCTZ  Losartan    Allergies:  Allergies as of 10/26/2021 - Reviewed 10/26/2021   Allergen Reaction Noted    Sulfamethoxazole  10/15/2012    Azithromycin Rash 10/15/2012    Zolpidem Other (See Comments) 08/18/2013    Amoxicillin Rash 06/09/2014    Atorvastatin Nausea Only 01/09/2014       Review of Systems:  A comprehensive review of systems was completed and negative except as noted in HPI.    PHYSICAL EXAM:      Vitals:    10/26/21 1306   BP: 148/90   Pulse: 87   Temp: 36.4 ??C (97.5 ??F)     General: Alert, NAD, appears well nourished  Eyes: sclera clear, anicteric, conjunctiva without erythema/injection  HEENT: nares patent, oropharyx without exudate or erythema  Heart: S1, S2 normal, no murmurs rubs or gallops.  Lungs: symmetric excursion of the chest, normal WOB on RA, CTAB, no focal crackles or wheezes   MSK: No lower extremity edema  Skin: WWP, raised erythematous papules on hands & inner elbows  Neuro: Alert, oriented, follows commands. Moving all extremities spontaneously  Psych: No agitation, anxiety, or depression    LABORATORY and RADIOLOGY DATA:     Pulmonary Function Tests:       Pulmonary Function Testing:  Date FEV1  Pre/Post (%) FVC  Pre/Post (%) FEV1/FVC  Pre/Post (%) DLCO   12/13/2020  2.60 (104%) / ** (**%) 1.82 (93%) / ** (**%) 70% / **% ** (**%)  10/13/21 1.81 (94%)/1.98 (102%) 2.68 (108%)/2.63 (106%) 75      Pertinent Laboratory Data:  Eos 0.4 on 9/26, 0.6 on 8/4 (prior to steroids)  IgE significantly elevated at 794    Pertinent Imaging Data:  Images personally reviewed and discussed with attending   CTA Chest 10/22/20: 1.0 cm lingular lobe solid pulmonary nodule, borderline hilar nodes. Debris within the R sup seg bronchus. No PE.   CT chest w/o contrast: Persistent lingular semisolid nodule. 6 mo f/u    ASSESSMENT and PLAN     Marguetta Imel is a 71 y.o. 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 PFTs July 2023 show borderline bronchodilator response & supranormal lung function. Impressive symptom control on dupixent.     1. Mild Intermittent Asthma: On initial visit, FVL was normal, but w/atopic phenotype with allergy/eczema/gerd with worsened respiratory sx during seasonal allergies & frequent episodes of bronchitis req steroids. Given her combination of mild respiratory symptoms and very poorly controlled atopic dermatitis, Dupixent was started at initial visit. She has since had significant improvement in eczema, rhinitis/sinusitis & asthma symptoms.    - Continue allergy control with flonase, cetirizine, singulair  - Continue GERD control with PPI   - Continue PRN Symbicort w/SMART therapy  - Continue Dupixent for asthma + severe eczema, has EpiPen   - Also follows w/ A&I, derm & ENT    2. Pulmonary Nodule: SPN 1.0 cm in lingula on 10/22/2020 CTA chest. Low risk given no smoking history. Persistent on follow up scan at 12 mo --recommended for repeat scan at 12 mo to ensure 2 yr stability.  - Repeat chest imaging in 12 mo    Ms. Yaeger was seen, examined and discussed with Dr. Reggie Pile who agrees with the assessment and plan above.   Follow up: Return in about 1 year (around 10/27/2022).    [facility], [facility]    Yoshua Geisinger E. Talmadge Coventry, MD  Pulmonary & Critical Care Fellow  Pager: [phone]

## 2021-10-26 ENCOUNTER — Ambulatory Visit: Admit: 2021-10-26 | Discharge: 2021-10-27 | Payer: MEDICARE

## 2021-10-26 DIAGNOSIS — R911 Solitary pulmonary nodule: Principal | ICD-10-CM

## 2021-10-26 DIAGNOSIS — K219 Gastro-esophageal reflux disease without esophagitis: Principal | ICD-10-CM

## 2021-10-26 DIAGNOSIS — J4521 Mild intermittent asthma with (acute) exacerbation: Principal | ICD-10-CM

## 2021-10-26 DIAGNOSIS — R062 Wheezing: Principal | ICD-10-CM

## 2021-10-26 DIAGNOSIS — R0600 Dyspnea, unspecified: Principal | ICD-10-CM

## 2021-10-26 DIAGNOSIS — L209 Atopic dermatitis, unspecified: Principal | ICD-10-CM

## 2021-10-26 NOTE — Unmapped (Signed)
It was a pleasure seeing you in clinic today! You were seen in clinic today by Dr. Talmadge Coventry & Dr. Glenard Haring    Here's a summary of what we discussed during your visit:  Keep going with the dupixent  Keep using the symbicort as needed for cough or shortness of breath  We will follow up your small pulmonary nodule with a scan in 1 yr.  Keep up the good work with taking your medications, using your inhalers, exercising, and eating healthily      Follow up with me in 1 year!    Jasmine Carson E. Talmadge Coventry, MD  [facility]      To contact your care team, you can either send a message via MyChart or contact the clinic at [phone].    How do I request medication refills?  Request a refill via [facility] (patient portal), call clinic at [phone] or have your pharmacy fax the request to [phone].    [facility]: [phone] *Pharmacy can mail medications to your home. You must call to request the medication be mailed.[facility]: [phone]  [facility]: [phone]    Here are some things you should know about contacting the [facility]:  Please be advised Epic now releases test results to MyChart as soon as they are available which means you will see your test results before I do.    The best way to reach your doctor for non-urgent matters is through MyChart. I can usually respond within two to three business day but I do not check messages after hours (evenings, weekends, and holidays) and often have other duties (inpatient hospital work, Producer, television/film/video activities, teaching) that make me unavailable.    - If you have sent a MyChart message to the clinic and have not received a response after three business days, please call our clinic at [phone] to speak to a nurse.     If you have an urgent issue that you feel needs a response the same day, you should also contact your primary care provider or be evaluated at an Urgent Care clinic.    For urgent lung issues after hours, you can call the hospital operator [phone]) and ask for the Pulmonary Fellow On Call. This doctor can provide some guidance and will send a message to your regular lung doctor the next morning.    If there is an emergency, call 911 or go to your closest emergency room.    I don't have a MyChart. Why should I get one?   - It's encrypted, so your information is secure  - It's a quick, easy way to contact the care team, manage appointments, see test results, and more!    How do I sign-up for MyChart?   - Download the MyChart app from the [employer] or [employer] and sign-up in the app  - Sign-up online at [email or web address]

## 2021-10-31 NOTE — Unmapped (Addendum)
[facility] Refill Coordination Note    Specialty Medication(s) to be Shipped:   CF/Pulmonary/Asthma: Dupixent 300mg /58ml    Other medication(s) to be shipped: No additional medications requested for fill at this time     Elizabe Gardinier, DOB: 1950-08-18  Phone: [phone] (home)       All above HIPAA information was verified with patient.     Was a Nurse, learning disability used for this call? No    Completed refill call assessment today to schedule patient's medication shipment from the [facility] [phone]).  All relevant notes have been reviewed.     Specialty medication(s) and dose(s) confirmed: Regimen is correct and unchanged.   Changes to medications: Marilea reports starting the following medications: Minoxidil topical solution last week. She uses it daily.  Changes to insurance: No  New side effects reported not previously addressed with a pharmacist or physician: None reported  Questions for the pharmacist: No    Confirmed patient received a Conservation officer, historic buildings and a Surveyor, mining with first shipment. The patient will receive a drug information handout for each medication shipped and additional FDA Medication Guides as required.       DISEASE/MEDICATION-SPECIFIC INFORMATION        For patients on injectable medications: Patient currently has 1 doses left.  Next injection is scheduled for 10/31/21.    SPECIALTY MEDICATION ADHERENCE     Medication Adherence    Patient reported X missed doses in the last month: 0  Specialty Medication: DUPIXENT PEN 300 mg/2 mL Pnij (dupilumab)  Patient is on additional specialty medications: No  Patient is on more than two specialty medications: No  Informant: patient  Reliability of informant: reliable  Provider-estimated medication adherence level: good  Reasons for non-adherence: no problems identified                                Were doses missed due to medication being on hold? No    DUPIXENT PEN 300 mg/2 mL Pnij (dupilumab)  : 0 days of medicine on hand        REFERRAL TO PHARMACIST     Referral to the pharmacist: Not needed      [facility]     Shipping address confirmed in Epic.     Delivery Scheduled: Yes, Expected medication delivery date: 11/03/21.     Medication will be delivered via Same Day Courier to the prescription address in Epic WAM.    Zimir Kittleson' W Wilhemena Durie Shared [facility] Specialty Technician

## 2021-11-03 MED FILL — DUPIXENT 300 MG/2 ML SUBCUTANEOUS PEN INJECTOR: SUBCUTANEOUS | 28 days supply | Qty: 4 | Fill #7

## 2021-11-08 LAB — IGE: TOTAL IGE: 58.9 [IU]/mL

## 2021-11-12 NOTE — Unmapped (Signed)
Teaching physician attestation:    I saw and evaluated the patient, participating in the key portions of the service. I reviewed the resident???s note. I assessed the laboratory findings and personally reviewed the radiology.  I agree with the resident???s findings and plan. I spent more than 45 minutes of medical time on the care of this patient.     Shirleen Schirmer, MD

## 2021-11-23 NOTE — Unmapped (Signed)
[facility] Refill Coordination Note    Specialty Medication(s) to be Shipped:   CF/Pulmonary/Asthma: Dupixent 300mg /    Other medication(s) to be shipped: No additional medications requested for fill at this time     Jasmine Carson, DOB: 01-03-1951  Phone: [phone] (home)       All above HIPAA information was verified with patient.     Was a Nurse, learning disability used for this call? No    Completed refill call assessment today to schedule patient's medication shipment from the [facility] [phone]).  All relevant notes have been reviewed.     Specialty medication(s) and dose(s) confirmed: Regimen is correct and unchanged.   Changes to medications: Zahirah reports no changes at this time.  Changes to insurance: No  New side effects reported not previously addressed with a pharmacist or physician: None reported  Questions for the pharmacist: No    Confirmed patient received a Conservation officer, historic buildings and a Surveyor, mining with first shipment. The patient will receive a drug information handout for each medication shipped and additional FDA Medication Guides as required.       DISEASE/MEDICATION-SPECIFIC INFORMATION        For patients on injectable medications: Patient currently has 1 doses left.  Next injection is scheduled for 11/28/21.    SPECIALTY MEDICATION ADHERENCE     Medication Adherence    Patient reported X missed doses in the last month: 0  Specialty Medication: DUPIXENT PEN 300 mg/2 mL Pnij (dupilumab)  Patient is on additional specialty medications: No  Patient is on more than two specialty medications: No  Informant: patient  Reliability of informant: reliable  Provider-estimated medication adherence level: good  Reasons for non-adherence: no problems identified                                Were doses missed due to medication being on hold? No    DUPIXENT PEN 300 mg/2 mL Pnij (dupilumab) : 14 days of medicine on hand        REFERRAL TO PHARMACIST     Referral to the pharmacist: Not needed      [facility]     Shipping address confirmed in Epic.     Delivery Scheduled: Yes, Expected medication delivery date: 12/06/21.     Medication will be delivered via Same Day Courier to the prescription address in Epic WAM.    Dajha Urquilla' W Wilhemena Durie Shared [facility] Specialty Technician

## 2021-12-06 MED FILL — DUPIXENT 300 MG/2 ML SUBCUTANEOUS PEN INJECTOR: SUBCUTANEOUS | 28 days supply | Qty: 4 | Fill #8

## 2021-12-28 NOTE — Unmapped (Signed)
[facility] Refill Coordination Note    Specialty Medication(s) to be Shipped:   CF/Pulmonary/Asthma: Dupixent 300mg /16ml    Other medication(s) to be shipped: No additional medications requested for fill at this time     Jasmine Carson, DOB: Jul 05, 1950  Phone: [phone] (home)       All above HIPAA information was verified with patient.     Was a Nurse, learning disability used for this call? No    Completed refill call assessment today to schedule patient's medication shipment from the [facility] [phone]).  All relevant notes have been reviewed.     Specialty medication(s) and dose(s) confirmed: Regimen is correct and unchanged.   Changes to medications: Jasmine Carson reports no changes at this time.  Changes to insurance: No  New side effects reported not previously addressed with a pharmacist or physician: None reported  Questions for the pharmacist: No    Confirmed patient received a Conservation officer, historic buildings and a Surveyor, mining with first shipment. The patient will receive a drug information handout for each medication shipped and additional FDA Medication Guides as required.       DISEASE/MEDICATION-SPECIFIC INFORMATION        For patients on injectable medications: Patient currently has 0 doses left.  Next injection is scheduled for 01/09/22.    SPECIALTY MEDICATION ADHERENCE     Medication Adherence    Specialty Medication: DUPIXENT PEN 300 mg/2 mL Pnij (dupilumab)  Patient is on additional specialty medications: No                                Were doses missed due to medication being on hold? No    DUPIXENT PEN 300 mg/2 mL Pnij (dupilumab)  : 0 days of medicine on hand        REFERRAL TO PHARMACIST     Referral to the pharmacist: Not needed      [facility]     Shipping address confirmed in Epic.     Delivery Scheduled: Yes, Expected medication delivery date: 01/04/22.     Medication will be delivered via Same Day Courier to the prescription address in Epic WAM.    Julian Askin' W Wilhemena Durie Shared [facility] Specialty Technician

## 2022-01-04 MED FILL — DUPIXENT 300 MG/2 ML SUBCUTANEOUS PEN INJECTOR: SUBCUTANEOUS | 28 days supply | Qty: 4 | Fill #9

## 2022-01-08 ENCOUNTER — Other Ambulatory Visit: Payer: Self-pay | Admitting: Dermatology

## 2022-01-08 DIAGNOSIS — L649 Androgenic alopecia, unspecified: Secondary | ICD-10-CM

## 2022-01-09 ENCOUNTER — Ambulatory Visit: Admit: 2022-01-09 | Discharge: 2022-01-10 | Payer: MEDICARE

## 2022-01-09 DIAGNOSIS — J3089 Other allergic rhinitis: Principal | ICD-10-CM

## 2022-01-09 DIAGNOSIS — L209 Atopic dermatitis, unspecified: Principal | ICD-10-CM

## 2022-01-09 DIAGNOSIS — J4521 Mild intermittent asthma with (acute) exacerbation: Principal | ICD-10-CM

## 2022-01-09 NOTE — Unmapped (Signed)
Allergy/Immunology   Clinic Note       Reason for visit: eczema, asthma, rhinitis    Assessment and Plan:   Diagnoses:  1. Atopic dermatitis, unspecified type    2. Seasonal allergic rhinitis due to other allergic trigger    3. Mild intermittent reactive airway disease with acute exacerbation      Assessment and Plan:  Jasmine Carson is a 71 y.o. female seen for the following:    Amoxicillin allergy  Low risk based on history (benign rash >5 years ago). Will proceed with 10-90 amox challenge as a nursing visit. Will be off of anti histamines for 5 days prior.    Allergic Rhinitis (nasal allergies): The history is consistent with both seasonal and perennial allergies. Blood testing confirmed sensitivity to mold.   Medications are as described below.   - Flonase nasal spray, 2  sprays per nostril BID as prescribed by ENT. Astelin 1 spay daily. Nasal sprays only work if used daily. Remember to tilt your head down, look at the floor, point the spray to the outside wall of your nose (towards the same-side ear) and spray. If you have to sniff, sniff gently. Do not sniff hard, you want the medication to stay in your nose. Take medications year-round.   -try stopping Singulair to see if you need this  - Cetirizine (Zyrtec) 10 mg daily as needed if still having symptoms   - Nasal saline rinses on a daily or twice daily basis using the sinus rinse kit.   - For ocular symptoms, rinse eyes with chilled artificial tears, followed by ketotifen (Alaway/Zaditor) eye drops (1 drop per eye up to 3x daily as needed.)      Atopic Dermatitis:  Jasmine Carson has moderate atopic dermatitis, which appears to be well controlled.   - treat eczema flares on the body with triamcinolone 0.1% ointment  and eczema flares on the face (or mild flares) with hydrocortisone 2.5% ointment , 2 times a day for 5-7 days to affected areas on body  - daily baths in warm water with moisturizing soon after bathing.  Minimize soap use, and use instead moisturizing bars, like those made by [employer] [place], Aveeno, Cetaphil, Cerave.   - use unscented thick moisturizer ointment/cream that comes in a tub, like petroleum jelly / Vaseline, Aquaphor, Cerave Healing Ointment (not the renewing cream),  or Cetaphil   - avoid lotions and liquid soaps as they can be drying.  Avoid scrubbing skin with washcloths/loofahs.    - Continue Dupixent 300mg  q2 weeks. She does see a dermatologist for this as well, and will see again tomorrow. Discussed that we can try stopping or spacing out to see if she needs this given she is now well controlled. She will consider and will talk more with derm tomorrow (likely will decide to space out after the holidays; she asks if long term risk on Dupi, discussed we are unaware of any long term side effects at this time). Also discussed she has options in terms of who to see. She can continuing seeing derm and pulm or decide to see [place] solely to manage her atopic conditions if this is ok with her other providers. Alternatively, she can see [place] PRN, it all depends on what she wants to do. She will let us know.    Asthma, mild intermittent  Good control, follows with pulmonology. Continue Symbicort PRN. Also on Dupixent a prescribed by pulmonology. Discussed that as above, we can try weaning this  given eczema and asthma are under good control. Will discuss with pulm if this is okay given they started medication.    Follow-up:  Return for 4 months w/ me, then nursing visit for 10-90 amox challenge .    Patient discussed with the attending, Dr. Craige Cotta, with whom the above assessment and plan were jointly formulated.    --  Melbourne Abts, MD  Allergy/Immunology Fellow   [facility]    I personally spent 40 minutes face-to-face and non-face-to-face in the care of this patient, which includes all pre, intra, and post visit time on the date of service.    Subjective:   HPI:  I had the pleasure of seeing Jasmine Carson in allergy/immunology clinic today, and she is a 71 y.o. female w/ a hx of hypertension, hyperlipidemia, CKD stage III, mild intermittent reactive airway disease, chronic cough, prediabetes, GERD, former tobacco use Seen for further evaluation of eczema, allergic rhinitis. A thorough review of the available medical records is also performed.    Established care with me 07/04/2021. HPI at that visit:  1. Atopic dermatitis for 40 years. Unclear triggers. All year round. On arms, hands, legs, abdomen. Every month would flare once before Dupixent. She would use triamcinolone 0.1% ointment which would help for 1 week. Rare flares since Dupixent. Did not use loofas. Used non scented moisturizing bars. No skin infections. Given mild asthma, eczema pulm at [facility] started her on Dupixent. Has seen OSH dermatology 3/28 to establish care. Uses a moisturizer daily to BID (Vaseline or Aveeno).     1. Rhinitis. Patient with symptoms of nasal congestion, itching, post nasal drip all year round, worse March to May, August to October. Symptoms triggered by cold, strong scents. No pets. Have tried: Zyrtec 10 mg, Flonase 2 sprays BID, Astelin 1 spray, Singulair 10mg , sinus rinse once a day. Well controlled on this regimen, but still with some symptoms. Since starting Dupixent has had improvement. Follows with ENT here, last seen 03/2021. Not interested in immunotherapy. Tested positive to mold by blood (ordered by pulm). Also sees ENT for rhinitis. On Flonase 2 sprays BID and nasal irrigations BID.    History of asthma dx last year. Hospitalized August 2022 for asthma (wheezing, difficulty breathing), rapidly improved with steroids and nebulizer. Leading up to this admission, had 6 months of cough and sinus infections. Had multipe rounds of pred/antibiotics. At dischare told to take [place] and combivent, but was so improved stopped it. Established care with pulm and is on PRN Symbicort and Dupixent given allergies (started 03/2021). Rare Symbicort use. Improvement w/ Dupixent.      Interval history:  -doing great from an atopic standpoint. On Flonase 2 sprays BID and nasal irrigation BID with minimal rhinitis. Also takes Zyrtec, Singulair daily.    -amoxcillin allergy: doesn't remember, but many years ago had a rash, tiny itchy bumps all over her body. Not sure what was being treated for or how long it lasted for. Maybe lasted for like 1 week. Used cortisone cream. Interested in delabeling.    -asthma is under great control, rare Symbicort use. No cough, SOB, wheezing, chest tightness    -eczema is under good control, rare use of TC since starting Dupi. Will see derm tomorrow. She will consider stopping Dupi after the holidays or spacing it out.    Review of Systems:  As per HPI, all other systems reviewed are negative.    Past Medical History:     Past Medical History:  Diagnosis Date   ??? Carpal tunnel syndrome    ??? CKD (chronic kidney disease)    ??? Eczema    ??? Gastroesophageal reflux    ??? Hypertension    ??? Seasonal allergies    ??? Type 2 diabetes mellitus without complication, without long-term current use of insulin (CMS-HCC) 07/26/2021       Past Surgical History:   Procedure Laterality Date   ??? CARPAL TUNNEL RELEASE Right    ??? HYSTERECTOMY         Medications:     Current Outpatient Medications   Medication Sig Dispense Refill   ??? ACCU-CHEK GUIDE TEST STRIPS Strp      ??? albuterol HFA 90 mcg/actuation inhaler Inhale 2 puffs every six (6) hours as needed for wheezing or shortness of breath. 8 g 0   ??? alendronate (FOSAMAX) 70 MG tablet TAKE 1 TABLET BY MOUTH ONCE A WEEK     ??? amLODIPine (NORVASC) 5 MG tablet Take 1 tablet (5 mg total) by mouth daily.     ??? azelastine (ASTELIN) 137 mcg (0.1 %) nasal spray PLACE 1 SPRAY INTO BOTH NOSTRILS 2 (TWO) TIMES DAILY     ??? budesonide-formoteroL (SYMBICORT) 80-4.5 mcg/actuation inhaler Inhale 2 puffs Two (2) times a day. 10.2 g 11   ??? buPROPion (WELLBUTRIN XL) 150 MG 24 hr tablet Take 1 tablet (150 mg total) by mouth daily.     ??? calcium carbonate-vitamin D3 (CALCIUM 600 WITH VITAMIN D3) 600 mg(1,500mg ) -400 unit cap Take 1 capsule by mouth once daily. 30 each 0   ??? cetirizine (ZYRTEC) 10 MG tablet Take 1 tablet (10 mg total) by mouth daily.     ??? dupilumab (DUPIXENT PEN) 300 mg/2 mL PnIj Inject the contents of 1 pen (300 mg) under the skin every fourteen (14) days. 4 mL 11   ??? empty container Misc Use as directed 1 each 0   ??? EPINEPHrine (EPIPEN) 0.3 mg/0.3 mL injection Inject 0.3 mL (0.3 mg total) into the muscle once as needed for anaphylaxis. 1 each 1   ??? fluticasone propionate (FLONASE) 50 mcg/actuation nasal spray 2 sprays into each nostril two (2) times a day. 32 g= 1 month supply 32 g 11   ??? losartan (COZAAR) 50 MG tablet Take 0.5 tablets (25 mg total) by mouth in the morning for 20 days. 10 tablet 0   ??? lovastatin (MEVACOR) 40 MG tablet Take 1 tablet (40 mg total) by mouth daily with evening meal.     ??? metFORMIN (GLUCOPHAGE-XR) 500 MG 24 hr tablet TAKE 1 TABLET BY MOUTH EVERY DAY WITH DINNER     ??? metoprolol succinate (TOPROL-XL) 25 MG 24 hr tablet Take 1 tablet (25 mg total) by mouth in the morning.     ??? montelukast (SINGULAIR) 10 mg tablet Take 1 tablet (10 mg total) by mouth.     ??? multivitamin-Ca-iron-minerals Tab Take 1 tablet by mouth daily.     ??? omeprazole (PRILOSEC) 20 MG capsule Take 1 capsule (20 mg total) by mouth in the morning.     ??? traZODone (DESYREL) 50 MG tablet      ??? triamcinolone (KENALOG) 0.1 % cream      ??? triamcinolone (KENALOG) 0.1 % ointment Apply topically Two (2) times a day. 80 g 0     No current facility-administered medications for this visit.       Allergies:     Allergies   Allergen Reactions   ??? Sulfamethoxazole  Other reaction(s): RASH   ??? Azithromycin Rash   ??? Zolpidem Other (See Comments)     sleepwalking  Other reaction(s): Other (See Comments)  sleepwalking  Other reaction(s): Other (See Comments)  sleepwalking     ??? Amoxicillin Rash   ??? Atorvastatin Nausea Only       Family History:     Family History   Problem Relation Age of Onset   ??? COPD Neg Hx    ??? Lung cancer Neg Hx    ??? Asthma Neg Hx        Social History:     Social History     Socioeconomic History   ??? Marital status: Married   Tobacco Use   ??? Smoking status: Former     Types: Cigarettes     Passive exposure: Current   ??? Smokeless tobacco: Former     Types: Snuff   Vaping Use   ??? Vaping Use: Never used   Substance and Sexual Activity   ??? Alcohol use: Yes     Alcohol/week: 2.0 standard drinks of alcohol     Types: 2 Glasses of wine per week     Comment: 2 drinks weekly   ??? Drug use: No   ??? Sexual activity: Not Currently     Social Determinants of Health     Financial Resource Strain: Low Risk  (10/23/2020)    Overall Financial Resource Strain (CARDIA)    ??? Difficulty of Paying Living Expenses: Not hard at all   Food Insecurity: No Food Insecurity (10/23/2020)    Hunger Vital Sign    ??? Worried About Running Out of Food in the Last Year: Never true    ??? Ran Out of Food in the Last Year: Never true   Transportation Needs: No Transportation Needs (10/23/2020)    PRAPARE - Transportation    ??? Lack of Transportation (Medical): No    ??? Lack of Transportation (Non-Medical): No       Objective:   PE:   Vitals:    01/09/22 1132   BP: 156/93   Pulse: 86       General:  Well nourished female in no acute distress without toxic appearance.  Skin:  No eczematous patches or rash. No dermatographism.  HEENT:  No conjunctival injection, non-prominent infraorbital folds; Anterior nasal examination: pink nasal mucosa, non-edematous inferior turbinates, no nasal septal deviation or visible polyps; granular oropharynx without exudates, ulcerations, or thrush;   CV: Regular rhythm; normal S1 and S2; no murmur, gallop or rub.  Respiratory:  Adequate inspiratory effort. Clear to auscultation bilaterally. No wheezes, crackles, or rhonchi.  Neurologic:  Alert and mental status appropriate for age; normal gait.  Musculoskeletal:  No peripheral edema. Normal bulk for age  Psychiatric: Relaxed, friendly, and cooperative with interview and examination. Euthymic affect. Normal thought process and thought content.    Investigations    Laboratory testing reviewed and pertinent for the following:    No visits with results within 4 Week(s) from this visit.   Latest known visit with results is:   Office Visit on 10/26/2021   Component Date Value   ??? IgE, Total 10/26/2021 58.9

## 2022-01-09 NOTE — Unmapped (Addendum)
-  please try stopping Singulair/Montelukast and seeing if you need this.    -please stop your zyrtec 5 days before your planned amoxicillin challenge! Do not take any anti histamines (including zyrtec, benadryl, allegra, etc).    -please discuss with dermatologist whether they think you warrant dupixent and with your pulmonologist. You can consider weaning and seeing how your asthma/eczema does.

## 2022-01-10 ENCOUNTER — Ambulatory Visit (INDEPENDENT_AMBULATORY_CARE_PROVIDER_SITE_OTHER): Payer: Medicare Other | Admitting: Dermatology

## 2022-01-10 DIAGNOSIS — L659 Nonscarring hair loss, unspecified: Secondary | ICD-10-CM | POA: Diagnosis not present

## 2022-01-10 DIAGNOSIS — L209 Atopic dermatitis, unspecified: Secondary | ICD-10-CM

## 2022-01-10 NOTE — Progress Notes (Signed)
Follow-Up Visit   Subjective  Debra Alexander is a 71 y.o. female who presents for the following: Alopecia (Patient here for 3 month follow up for hair loss, possible telogen effluvium. Was prescribed oral minoxidil 2.5 mg tab to start at last appointment. Reports taking for a few days and had to stop. States made feel "funny".  Hair has regrown on its own. Currently using no treatments for hair loss. ) and Eczema (Patient currently on dupixent injections prescribed by pulmonologist for asthma and tmc prn for flares at arms, legs, and hands. Patient states has not had to use any cream and is staying clear. ).  She has had longstanding hair loss on posterior crown, and this hasn't changed, but that doesn't bother her.  The hair has grown back on front of scalp and sides.   The following portions of the chart were reviewed this encounter and updated as appropriate:      Review of Systems: No other skin or systemic complaints except as noted in HPI or Assessment and Plan.   Objective  Well appearing patient in no apparent distress; mood and affect are within normal limits.  A focused examination was performed including scalp, hands. Relevant physical exam findings are noted in the Assessment and Plan.  hands, legs, arms Skin clear today at exam   Scalp Regrowth at frontal and temporal scalp when compared to baseline photos, scarring alopecia at posterior crown with loss of follicles          Assessment & Plan  Atopic dermatitis, unspecified type hands, legs, arms  Chronic condition with duration or expected duration over one year. Currently well-controlled on Dupixent, prescribed by pulmonology.   Atopic dermatitis - Severe, on Dupixent (biologic medication).  Atopic dermatitis (eczema) is a chronic, relapsing, pruritic condition that can significantly affect quality of life. It is often associated with allergic rhinitis and/or asthma and can require treatment with topical  medications, phototherapy, or in severe cases a biologic medication called Dupixent, which requires long term medication management.     Continue Dupixent as prescribed by pulmonologist for asthma Continue TMC 0.1 % cream 1-2 times daily as needed for flares. Avoid applying to face, groin, and axilla. Use as directed. Long-term use can cause thinning of the skin.   Dupilumab (Dupixent) is a treatment given by injection for adults and children with moderate-to-severe atopic dermatitis. Goal is control of skin condition, not cure. It is given as 2 injections at the first dose followed by 1 injection ever 2 weeks thereafter.  Young children are dosed monthly.   Potential side effects include allergic reaction, herpes infections, injection site reactions and conjunctivitis (inflammation of the eyes).  The use of Dupixent requires long term medication management, including periodic office visits.   Topical steroids (such as triamcinolone, fluocinolone, fluocinonide, mometasone, clobetasol, halobetasol, betamethasone, hydrocortisone) can cause thinning and lightening of the skin if they are used for too long in the same area. Your physician has selected the right strength medicine for your problem and area affected on the body. Please use your medication only as directed by your physician to prevent side effects.     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 Patient improving.  CCCA is a chronic/progressive and irreversible patterned form of scarring alopecia that most commonly affects middle-aged women of African descent that presents with progressive hair loss, starting as a single patch at  the vertex of the scalp and then expanding in a centrifugal and symmetrical pattern on the crown. Triggering or aggravation of the disease may occur following traumatic hair care practices, such as cornrows  and braiding, extensions, weaves with sewn-in or glued-on hair, use of hot combs, and frequent use of hair relaxers. These practices should be discontinued to slow progression of disease. Therapies may stop or slow progression but generally do not lead to hair regrowth in scarred areas.    Will not restart oral minoxidil due to side effects   Pt not interested in treatments for CCCA at this time.      Return if symptoms worsen or fail to improve.  I, Ruthell Rummage, CMA, am acting as scribe for Brendolyn Patty, MD.  Documentation: I have reviewed the above documentation for accuracy and completeness, and I agree with the above.  Brendolyn Patty MD

## 2022-01-10 NOTE — Patient Instructions (Addendum)
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 Triggering or aggravation of the disease may occur following traumatic hair care practices, such as cornrows and braiding, extensions, weaves with sewn-in or glued-on hair, use of hot combs, and frequent use of hair relaxers. These practices should be discontinued to slow progression of disease. Therapies may stop or slow progression but generally do not lead to hair regrowth in scarred areas.        Due to recent changes in healthcare laws, you may see results of your pathology and/or laboratory studies on MyChart before the doctors have had a chance to review them. We understand that in some cases there may be results that are confusing or concerning to you. Please understand that not all results are received at the same time and often the doctors may need to interpret multiple results in order to provide you with the best plan of care or course of treatment. Therefore, we ask that you please give [place] 2 business days to thoroughly review all your results before contacting the office for clarification. Should we see a critical lab result, you will be contacted sooner.   If You Need Anything After Your Visit  If you have any questions or concerns for your doctor, please call our main line at [phone] and press option 4 to reach your doctor's medical assistant. If no one answers, please leave a voicemail as directed and we will return your call as soon as possible. Messages left after 4 pm will be answered the following business day.   You may also send [place] a message via [place]. We typically respond to MyChart messages within 1-2 business days.  For prescription refills, please ask your  pharmacy to contact our office. Our fax number is [phone].  If you have an urgent issue when the clinic is closed that cannot wait until the next business day, you can page your doctor at the number below.    Please note that while we do our best to be available for urgent issues outside of office hours, we are not available 24/7.   If you have an urgent issue and are unable to reach [place], you may choose to seek medical care at your doctor's office, retail clinic, urgent care center, or emergency room.  If you have a medical emergency, please immediately call 911 or go to the emergency department.  Pager Numbers  - Dr. Nehemiah Massed: [phone]  - Dr. Laurence Ferrari: [phone]  - Dr. Nicole Kindred: [phone]  In the event of inclement weather, please call our main line at [phone] for an update on the status of any delays or closures.  Dermatology Medication Tips: Please keep the boxes that topical medications come in in order to help keep track of the instructions about where and how to use these. Pharmacies typically print the medication instructions only on the boxes and not directly on the medication tubes.   If your medication is too expensive, please contact our office at [phone] option 4 or send [place] a message through [place].   We are unable to tell what your co-pay for medications will be in advance as this is different depending on your insurance coverage. However, we may be able to find a substitute medication at lower cost or  fill out paperwork to get insurance to cover a needed medication.   If a prior authorization is required to get your medication covered by your insurance company, please allow [place] 1-2 business days to complete this process.  Drug prices often vary depending on where the prescription is filled and some pharmacies may offer cheaper prices.  The website www.goodrx.com contains coupons for medications through different pharmacies. The prices here do not  account for what the cost may be with help from insurance (it may be cheaper with your insurance), but the website can give you the price if you did not use any insurance.  - You can print the associated coupon and take it with your prescription to the pharmacy.  - You may also stop by our office during regular business hours and pick up a GoodRx coupon card.  - If you need your prescription sent electronically to a different pharmacy, notify our office through [facility] or by phone at [phone] option 4.     Si Usted Necesita Algo Despus de Su Visita  Tambin puede enviarnos un mensaje a travs de Pharmacist, community. Por lo general respondemos a los mensajes de MyChart en el transcurso de 1 a 2 das hbiles.  Para renovar recetas, por favor pida a su farmacia que se ponga en contacto con nuestra oficina. Harland Dingwall de fax es [place] [phone].  Si tiene un asunto urgente cuando la clnica est cerrada y que no puede esperar hasta el siguiente da hbil, puede llamar/localizar a su doctor(a) al nmero que aparece a continuacin.   Por favor, tenga en cuenta que aunque hacemos todo lo posible para estar disponibles para asuntos urgentes fuera del horario de [place], no estamos disponibles las 24 horas del da, los 7 das de la [place].   Si tiene un problema urgente y no puede comunicarse con nosotros, puede optar por buscar atencin mdica  en el consultorio de su doctor(a), en una clnica privada, en un centro de atencin urgente o en una sala de emergencias.  Si tiene Engineering geologist, por favor llame inmediatamente al 911 o vaya a la sala de emergencias.  Nmeros de bper  - Dr. Nehemiah Massed: [phone]  - Dra. Moye: [phone]  - Dra. Nicole Kindred: [phone]  En caso de inclemencias del [place], por favor llame a Johnsie Kindred principal al [phone] para una actualizacin sobre el [place] de cualquier retraso o cierre.  Consejos para la medicacin en dermatologa: Por  favor, guarde las cajas en las que vienen los medicamentos de uso tpico para ayudarle a seguir las instrucciones sobre dnde y cmo usarlos. Las farmacias generalmente imprimen las instrucciones del medicamento slo en las cajas y no directamente en los tubos del [place].   Si su medicamento es muy caro, por favor, pngase en contacto con Zigmund Daniel llamando al [phone] y presione la opcin 4 o envenos un mensaje a travs de Pharmacist, community.   No podemos decirle cul ser su copago por los medicamentos por adelantado ya que esto es diferente dependiendo de la cobertura de su seguro. Sin embargo, es posible que podamos encontrar un medicamento sustituto a Electrical engineer un formulario para que el seguro cubra el medicamento que se considera necesario.   Si se requiere una autorizacin previa para que su compaa de seguros [place] su medicamento, por favor permtanos de 1 a 2 das hbiles para completar este proceso.  Los precios de los medicamentos varan con frecuencia dependiendo del Environmental consultant de dnde se surte la receta  y [place] farmacias pueden ofrecer precios ms baratos.  El sitio web www.goodrx.com tiene cupones para medicamentos de Airline pilot. Los precios aqu no tienen en cuenta lo que podra costar con la ayuda del seguro (puede ser ms barato con su seguro), pero el sitio web puede darle el precio si no utiliz Research scientist (physical sciences).  - Puede imprimir el cupn correspondiente y llevarlo con su receta a la farmacia.  - Tambin puede pasar por nuestra oficina durante el horario de atencin regular y Charity fundraiser una tarjeta de cupones de GoodRx.  - Si necesita que su receta se enve electrnicamente a una farmacia diferente, informe a nuestra oficina a travs de MyChart de [place] o por telfono llamando al [phone] y presione la opcin 4.

## 2022-01-23 NOTE — Unmapped (Signed)
[facility] Pharmacy Refill Coordination Note    Specialty Medication(s) to be Shipped:   CF/Pulmonary/Asthma: Dupixent 300mg /46ml    Other medication(s) to be shipped: No additional medications requested for fill at this time     Jasmine Carson, DOB: [birth year]  Phone: [phone] (home)       All above HIPAA information was verified with patient.     Was a Nurse, learning disability used for this call? No    Completed refill call assessment today to schedule patient's medication shipment from the [facility] [phone]).  All relevant notes have been reviewed.     Specialty medication(s) and dose(s) confirmed: Regimen is correct and unchanged.   Changes to medications: Jasmine Carson reports no changes at this time.  Changes to insurance: No  New side effects reported not previously addressed with a pharmacist or physician: None reported  Questions for the pharmacist: No    Confirmed patient received a Conservation officer, historic buildings and a Surveyor, mining with first shipment. The patient will receive a drug information handout for each medication shipped and additional FDA Medication Guides as required.       DISEASE/MEDICATION-SPECIFIC INFORMATION        For patients on injectable medications: Patient currently has 0 doses left.  Next injection is scheduled for 02/06/22.    SPECIALTY MEDICATION ADHERENCE     Medication Adherence    Patient reported X missed doses in the last month: 0  Specialty Medication: DUPIXENT PEN 300 mg/2 mL Pnij (dupilumab)  Patient is on additional specialty medications: No  Patient is on more than two specialty medications: No  Any gaps in refill history greater than 2 weeks in the last 3 months: no  Demonstrates understanding of importance of adherence: yes  Informant: patient  Reliability of informant: reliable  Provider-estimated medication adherence level: good  Patient is at risk for Non-Adherence: No  Reasons for non-adherence: no problems identified Were doses missed due to medication being on hold? No    DUPIXENT PEN 300 mg/2 mL Pnij (dupilumab) : 0 days of medicine on hand       REFERRAL TO PHARMACIST     Referral to the pharmacist: Not needed      [facility]     Shipping address confirmed in Epic.     Delivery Scheduled: Yes, Expected medication delivery date: 02/03/22.     Medication will be delivered via Same Day Courier to the prescription address in Epic WAM.    Jasmine Carson' W Wilhemena Durie Shared [facility] Specialty Technician

## 2022-01-31 ENCOUNTER — Other Ambulatory Visit: Payer: Self-pay | Admitting: Gerontology

## 2022-01-31 DIAGNOSIS — Z1231 Encounter for screening mammogram for malignant neoplasm of breast: Secondary | ICD-10-CM

## 2022-02-03 MED FILL — DUPIXENT 300 MG/2 ML SUBCUTANEOUS PEN INJECTOR: SUBCUTANEOUS | 28 days supply | Qty: 4 | Fill #10

## 2022-02-21 NOTE — Unmapped (Signed)
[facility] Specialty Pharmacy Refill Coordination Note    Specialty Medication(s) to be Shipped:   CF/Pulmonary/Asthma: Dupixent 300mg /11ml    Other medication(s) to be shipped: No additional medications requested for fill at this time     Jasmine Carson, DOB: Mar 16, 1951  Phone: [phone] (home)       All above HIPAA information was verified with patient.     Was a Nurse, learning disability used for this call? No    Completed refill call assessment today to schedule patient's medication shipment from the [facility] [phone]).  All relevant notes have been reviewed.     Specialty medication(s) and dose(s) confirmed: Regimen is correct and unchanged.   Changes to medications: Jasmine Carson reports no changes at this time.  Changes to insurance: No  New side effects reported not previously addressed with a pharmacist or physician: None reported  Questions for the pharmacist: No    Confirmed patient received a Conservation officer, historic buildings and a Surveyor, mining with first shipment. The patient will receive a drug information handout for each medication shipped and additional FDA Medication Guides as required.       DISEASE/MEDICATION-SPECIFIC INFORMATION        For patients on injectable medications: Patient currently has 0 doses left.  Next injection is scheduled for 03/06/22.    SPECIALTY MEDICATION ADHERENCE     Medication Adherence    Patient reported X missed doses in the last month: 0  Specialty Medication: DUPIXENT PEN 300 mg/2 mL Pnij (dupilumab)  Patient is on additional specialty medications: No  Patient is on more than two specialty medications: No  Any gaps in refill history greater than 2 weeks in the last 3 months: no  Demonstrates understanding of importance of adherence: yes  Informant: patient  Reliability of informant: reliable  Provider-estimated medication adherence level: good  Patient is at risk for Non-Adherence: No  Reasons for non-adherence: no problems identified Were doses missed due to medication being on hold? No    DUPIXENT PEN 300 mg/2 mL Pnij (dupilumab)  : 0 days of medicine on hand        REFERRAL TO PHARMACIST     Referral to the pharmacist: Not needed      [facility]     Shipping address confirmed in Epic.     Delivery Scheduled: Yes, Expected medication delivery date: 02/27/22.     Medication will be delivered via Same Day Courier to the prescription address in Epic WAM.    Nyhla Mountjoy' W Wilhemena Durie Shared [facility] Specialty Technician

## 2022-02-27 MED FILL — DUPIXENT 300 MG/2 ML SUBCUTANEOUS PEN INJECTOR: SUBCUTANEOUS | 28 days supply | Qty: 4 | Fill #11

## 2022-03-01 DIAGNOSIS — L209 Atopic dermatitis, unspecified: Principal | ICD-10-CM

## 2022-03-01 DIAGNOSIS — Z889 Allergy status to unspecified drugs, medicaments and biological substances status: Principal | ICD-10-CM

## 2022-03-01 DIAGNOSIS — J4521 Mild intermittent asthma with (acute) exacerbation: Principal | ICD-10-CM

## 2022-03-01 MED ORDER — DUPIXENT 300 MG/2 ML SUBCUTANEOUS PEN INJECTOR
SUBCUTANEOUS | 11 refills | 28 days
Start: 2022-03-01 — End: 2023-03-01

## 2022-03-03 MED ORDER — DUPIXENT 300 MG/2 ML SUBCUTANEOUS PEN INJECTOR
SUBCUTANEOUS | 11 refills | 28 days | Status: CP
Start: 2022-03-03 — End: 2023-03-03
  Filled 2022-03-30: qty 4, 28d supply, fill #0

## 2022-03-22 NOTE — Unmapped (Signed)
[facility] Pharmacy Refill Coordination Note    Specialty Medication(s) to be Shipped:   CF/Pulmonary/Asthma: Dupixent 300mg /23ml    Other medication(s) to be shipped: No additional medications requested for fill at this time     Jasmine Carson, DOB: February 10, 1951  Phone: [phone] (home)       All above HIPAA information was verified with patient.     Was a Nurse, learning disability used for this call? No    Completed refill call assessment today to schedule patient's medication shipment from the [facility] [phone]).  All relevant notes have been reviewed.     Specialty medication(s) and dose(s) confirmed: Regimen is correct and unchanged.   Changes to medications: Jasmine Carson reports no changes at this time.  Changes to insurance: No  New side effects reported not previously addressed with a pharmacist or physician: None reported  Questions for the pharmacist: No    Confirmed patient received a Conservation officer, historic buildings and a Surveyor, mining with first shipment. The patient will receive a drug information handout for each medication shipped and additional FDA Medication Guides as required.       DISEASE/MEDICATION-SPECIFIC INFORMATION        For patients on injectable medications: Patient currently has 0 doses left.  Next injection is scheduled for 04/03/22.    SPECIALTY MEDICATION ADHERENCE     Medication Adherence    Patient reported X missed doses in the last month: 0  Specialty Medication: DUPIXENT PEN 300 mg/2 mL Pnij (dupilumab)  Patient is on additional specialty medications: No  Patient is on more than two specialty medications: No  Any gaps in refill history greater than 2 weeks in the last 3 months: no  Demonstrates understanding of importance of adherence: yes  Informant: patient  Reliability of informant: reliable  Provider-estimated medication adherence level: good  Patient is at risk for Non-Adherence: No  Reasons for non-adherence: no problems identified Were doses missed due to medication being on hold? No    DUPIXENT PEN 300 mg/2 mL Pnij (dupilumab)  : 0 days of medicine on hand       REFERRAL TO PHARMACIST     Referral to the pharmacist: Not needed      [facility]     Shipping address confirmed in Epic.     Delivery Scheduled: Yes, Expected medication delivery date: 03/30/22.     Medication will be delivered via Same Day Courier to the prescription address in Epic WAM.    Mintie Witherington' W Danae Chen Shared [facility] Specialty Technician

## 2022-04-19 NOTE — Unmapped (Signed)
[facility] Pharm[facility] Refill Coordination Note    Specialty Medication(s) to be Shipped:   CF/Pulmonary/Asthma: Dupixent 300mg /47ml    Other medication(s) to be shipped: No additional medications requested for fill at this time     Jasmine Carson, DOB: 24-Apr-1950  Phone: [phone] (home)       All above HIPAA information was verified with patient.     Was a Nurse, learning disability used for this call? No    Completed refill call assessment today to schedule patient's medication shipment from the [facility] [phone]).  All relevant notes have been reviewed.     Specialty medication(s) and dose(s) confirmed: Regimen is correct and unchanged.   Changes to medications: Rojean reports no changes at this time.  Changes to insurance: No  New side effects reported not previously addressed with a pharmacist or physician: None reported  Questions for the pharmacist: No    Confirmed patient received a Conservation officer, historic buildings and a Surveyor, mining with first shipment. The patient will receive a drug information handout for each medication shipped and additional FDA Medication Guides as required.       DISEASE/MEDICATION-SPECIFIC INFORMATION        For patients on injectable medications: Patient currently has 0 doses left.  Next injection is scheduled for 05/01/22.    SPECIALTY MEDICATION ADHERENCE     Medication Adherence    Patient reported X missed doses in the last month: 0  Specialty Medication: DUPIXENT PEN 300 mg/2 mL Pnij (dupilumab)  Patient is on additional specialty medications: No  Patient is on more than two specialty medications: No  Any gaps in refill history greater than 2 weeks in the last 3 months: no  Demonstrates understanding of importance of adherence: yes  Informant: patient  Reliability of informant: reliable  Provider-estimated medication adherence level: good  Patient is at risk for Non-Adherence: No  Reasons for non-adherence: no problems identified Were doses missed due to medication being on hold? No    DUPIXENT PEN 300 mg/2 mL Pnij (dupilumab)  : 0 days of medicine on hand       REFERRAL TO PHARMACIST     Referral to the pharmacist: Not needed      [facility]     Shipping address confirmed in Epic.     Delivery Scheduled: Yes, Expected medication delivery date: 04/26/22.     Medication will be delivered via Same Day Courier to the prescription address in Epic WAM.    Jasmine Carson' W Danae Chen Shared [facility] Specialty Technician

## 2022-04-26 MED FILL — DUPIXENT 300 MG/2 ML SUBCUTANEOUS PEN INJECTOR: SUBCUTANEOUS | 28 days supply | Qty: 4 | Fill #1

## 2022-05-16 NOTE — Unmapped (Signed)
[facility] Refill Coordination Note    Specialty Medication(s) to be Shipped:   CF/Pulmonary/Asthma: Dupixent 300 mg/2 mL    Other medication(s) to be shipped: No additional medications requested for fill at this time     Jasmine Carson, DOB: 1951/02/06  Phone: [phone] (home)       All above HIPAA information was verified with patient.     Was a Nurse, learning disability used for this call? No    Completed refill call assessment today to schedule patient's medication shipment from the [facility] [phone]).  All relevant notes have been reviewed.     Specialty medication(s) and dose(s) confirmed: Regimen is correct and unchanged.   Changes to medications: Jasmine Carson reports no changes at this time.  Changes to insurance: No  New side effects reported not previously addressed with a pharmacist or physician: None reported  Questions for the pharmacist: No    Confirmed patient received a Conservation officer, historic buildings and a Surveyor, mining with first shipment. The patient will receive a drug information handout for each medication shipped and additional FDA Medication Guides as required.       DISEASE/MEDICATION-SPECIFIC INFORMATION        For patients on injectable medications: Patient currently has 0 doses left.  Next injection is scheduled for 3/11.    SPECIALTY MEDICATION ADHERENCE     Medication Adherence    Patient reported X missed doses in the last month: 0  Specialty Medication: DUPIXENT PEN 300 mg/2 mL Pnij (dupilumab)  Patient is on additional specialty medications: No              Were doses missed due to medication being on hold? No    Dupixent 300/2 mg/ml: 0 days of medicine on hand        REFERRAL TO PHARMACIST     Referral to the pharmacist: Not needed      [facility]     Shipping address confirmed in Epic.     Patient was notified of new phone menu : No    Delivery Scheduled: Yes, Expected medication delivery date: 05/24/22.     Medication will be delivered via Same Day Courier to the prescription address in Epic WAM.    Jasmine Carson   [facility] Specialty Technician

## 2022-05-24 MED FILL — DUPIXENT 300 MG/2 ML SUBCUTANEOUS PEN INJECTOR: SUBCUTANEOUS | 28 days supply | Qty: 4 | Fill #2

## 2022-06-21 NOTE — Unmapped (Signed)
[facility] Clinical Assessment & Refill Coordination Note    Jasmine Carson, DOB: 1950-06-09  Phone: [phone] (home)     All above HIPAA information was verified with patient.     Was a Nurse, learning disability used for this call? No    Specialty Medication(s):   CF/Pulmonary/Asthma: Dupixent 300 mg/72mL     Current Outpatient Medications   Medication Sig Dispense Refill    ACCU-CHEK GUIDE TEST STRIPS Strp       albuterol HFA 90 mcg/actuation inhaler Inhale 2 puffs every six (6) hours as needed for wheezing or shortness of breath. 8 g 0    alendronate (FOSAMAX) 70 MG tablet TAKE 1 TABLET BY MOUTH ONCE A WEEK      amLODIPine (NORVASC) 5 MG tablet Take 1 tablet (5 mg total) by mouth daily.      azelastine (ASTELIN) 137 mcg (0.1 %) nasal spray PLACE 1 SPRAY INTO BOTH NOSTRILS 2 (TWO) TIMES DAILY      budesonide-formoteroL (SYMBICORT) 80-4.5 mcg/actuation inhaler Inhale 2 puffs Two (2) times a day. 10.2 g 11    buPROPion (WELLBUTRIN XL) 150 MG 24 hr tablet Take 1 tablet (150 mg total) by mouth daily.      calcium carbonate-vitamin D3 (CALCIUM 600 WITH VITAMIN D3) 600 mg(1,500mg ) -400 unit cap Take 1 capsule by mouth once daily. 30 each 0    cetirizine (ZYRTEC) 10 MG tablet Take 1 tablet (10 mg total) by mouth daily.      dupilumab (DUPIXENT PEN) 300 mg/2 mL PnIj Inject the contents of 1 pen (300 mg total) under the skin every fourteen (14) days. 4 mL 11    empty container Misc Use as directed 1 each 0    EPINEPHrine (EPIPEN) 0.3 mg/0.3 mL injection Inject 0.3 mL (0.3 mg total) into the muscle once as needed for anaphylaxis. 1 each 1    fluticasone propionate (FLONASE) 50 mcg/actuation nasal spray 2 sprays into each nostril two (2) times a day. 32 g= 1 month supply 32 g 11    losartan (COZAAR) 50 MG tablet Take 0.5 tablets (25 mg total) by mouth in the morning for 20 days. 10 tablet 0    lovastatin (MEVACOR) 40 MG tablet Take 1 tablet (40 mg total) by mouth daily with evening meal.      metFORMIN (GLUCOPHAGE-XR) 500 MG 24 hr tablet TAKE 1 TABLET BY MOUTH EVERY DAY WITH DINNER      metoprolol succinate (TOPROL-XL) 25 MG 24 hr tablet Take 1 tablet (25 mg total) by mouth in the morning.      montelukast (SINGULAIR) 10 mg tablet Take 1 tablet (10 mg total) by mouth.      multivitamin-Ca-iron-minerals Tab Take 1 tablet by mouth daily.      omeprazole (PRILOSEC) 20 MG capsule Take 1 capsule (20 mg total) by mouth in the morning.      traZODone (DESYREL) 50 MG tablet       triamcinolone (KENALOG) 0.1 % cream       triamcinolone (KENALOG) 0.1 % ointment Apply topically Two (2) times a day. 80 g 0     No current facility-administered medications for this visit.        Changes to medications: Jasmine Carson reports no changes at this time.    Allergies   Allergen Reactions    Sulfamethoxazole      Other reaction(s): RASH    Azithromycin Rash    Zolpidem Other (See Comments)     sleepwalking  Other  reaction(s): Other (See Comments)  sleepwalking  Other reaction(s): Other (See Comments)  sleepwalking      Amoxicillin Rash    Atorvastatin Nausea Only       Changes to allergies: No    SPECIALTY MEDICATION ADHERENCE     Dupixent 300  mg/76mL : 0 days of medicine on hand     Medication Adherence    Patient reported X missed doses in the last month: 0  Specialty Medication: Dupixent 300 mg/74mL Q14d  Patient is on additional specialty medications: No  Patient is on more than two specialty medications: No  Any gaps in refill history greater than 2 weeks in the last 3 months: no  Demonstrates understanding of importance of adherence: yes  Informant: patient          Specialty medication(s) dose(s) confirmed: Regimen is correct and unchanged.     Are there any concerns with adherence? No    Adherence counseling provided? Not needed    CLINICAL MANAGEMENT AND INTERVENTION      Clinical Benefit Assessment:    Do you feel the medicine is effective or helping your condition? Yes    Clinical Benefit counseling provided? Progress note from 10/23 shows evidence of clinical benefit    Adverse Effects Assessment:    Are you experiencing any side effects? No    Are you experiencing difficulty administering your medicine? No    Quality of Life Assessment:    Quality of Life    Rheumatology  Oncology  Dermatology  Cystic Fibrosis          How many days over the past month did your asthma/ezcema  keep you from your normal activities? For example, brushing your teeth or getting up in the morning. 0    Have you discussed this with your provider? Not needed    Acute Infection Status:    Acute infections noted within Epic:  No active infections  Patient reported infection: None    Therapy Appropriateness:    Is therapy appropriate and patient progressing towards therapeutic goals? Yes, therapy is appropriate and should be continued    DISEASE/MEDICATION-SPECIFIC INFORMATION      For patients on injectable medications: Patient currently has 0 doses left.  Next injection is scheduled for 06/26/22.    Chronic Inflammatory Diseases: Have you experienced any flares in the last month? No  Asthma and Allergy: Have you had an asthma exacerbation in the last 30 days? No  Have you needed to use your rescue inhaler more often than usual in the last 30 days? No  Have you needed to take steroids for your asthma in the last 30 days? No    PATIENT SPECIFIC NEEDS     Does the patient have any physical, cognitive, or cultural barriers? No    Is the patient high risk? No    Did the patient require a clinical intervention? No    Does the patient require physician intervention or other additional services (i.e., nutrition, smoking cessation, social work)? No    SOCIAL DETERMINANTS OF HEALTH     At the [facility], we have learned that life circumstances - like trouble affording food, housing, utilities, or transportation can affect the health of many of our patients.   That is why we wanted to ask: are you currently experiencing any life circumstances that are negatively impacting your health and/or quality of life? Patient declined to answer    Social Determinants of Psychologist, prison and probation services  Strain: Low Risk  (10/23/2020)    Overall Financial Resource Strain (CARDIA)     Difficulty of Paying Living Expenses: Not hard at all   Internet Connectivity: Not on file   Food Insecurity: No Food Insecurity (10/23/2020)    Hunger Vital Sign     Worried About Running Out of Food in the Last Year: Never true     Ran Out of Food in the Last Year: Never true   Tobacco Use: Medium Risk (01/09/2022)    Patient History     Smoking Tobacco Use: Former     Smokeless Tobacco Use: Former     Passive Exposure: Current   Housing/Utilities: Low Risk  (10/23/2020)    Housing/Utilities     Within the past 12 months, have you ever stayed: outside, in a car, in a tent, in an overnight shelter, or temporarily in someone else's home (i.e. couch-surfing)?: No     Are you worried about losing your housing?: No     Within the past 12 months, have you been unable to get utilities (heat, electricity) when it was really needed?: No   Alcohol Use: Not At Risk (03/14/2017)    Received from [facility]    AUDIT-C     Frequency of Alcohol Consumption: 2-3 times a week     Average Number of Drinks: 1 or 2     Frequency of Binge Drinking: Never   Transportation Needs: No Transportation Needs (10/23/2020)    PRAPARE - Transportation     Lack of Transportation (Medical): No     Lack of Transportation (Non-Medical): No   Substance Use: Not on file   Health Literacy: Not on file   Physical Activity: Insufficiently Active (03/14/2017)    Received from [facility]    Exercise Vital Sign     Days of Exercise per Week: 3 days     Minutes of Exercise per Session: 30 min   Interpersonal Safety: Not on file   Stress: No Stress Concern Present (03/14/2017)    Received from [facility] - Occupational Stress Questionnaire     Feeling of Stress : Only a little   Intimate Partner Violence: Not on file   Depression: Not at risk (01/24/2021)    Received from [facility]    PHQ-2     Total Score =: 0   Social Connections: Socially Integrated (03/14/2017)    Received from [facility]    Social Connection and Isolation Panel [NHANES]     Frequency of Communication with Friends and Family: More than three times a week     Frequency of Social Gatherings with Friends and Family: Not asked     Attends Religious Services: More than 4 times per year     Active Member of [employer] or Organizations: Yes     Attends Engineer, structural: More than 4 times per year     Marital Status: Married       Would you be willing to receive help with any of the needs that you have identified today? Not applicable       SHIPPING     Specialty Medication(s) to be Shipped:   CF/Pulmonary/Asthma: Dupixent 300 mg/55mL    Other medication(s) to be shipped: No additional medications requested for fill at this time     Changes to insurance: No    Patient was informed of new  phone menu: No    Delivery Scheduled: Yes, Expected medication delivery date: 06/23/22.     Medication will be delivered via Same Day Courier to the confirmed prescription address in [facility].    The patient will receive a drug information handout for each medication shipped and additional FDA Medication Guides as required.  Verified that patient has previously received a Conservation officer, historic buildings and a Surveyor, mining.    The patient or caregiver noted above participated in the development of this care plan and knows that they can request review of or adjustments to the care plan at any time.      All of the patient's questions and concerns have been addressed.    Oliva Bustard, PharmD   [facility] Specialty Pharmacist

## 2022-06-23 MED FILL — DUPIXENT 300 MG/2 ML SUBCUTANEOUS PEN INJECTOR: SUBCUTANEOUS | 28 days supply | Qty: 4 | Fill #3

## 2022-07-13 NOTE — Unmapped (Signed)
[facility] Refill Coordination Note    Specialty Medication(s) to be Shipped:   CF/Pulmonary/Asthma: Dupixent 300 mg/2 mL    Other medication(s) to be shipped: No additional medications requested for fill at this time     Jasmine Carson, DOB: 1950-11-27  Phone: [phone] (home)       All above HIPAA information was verified with patient.     Was a Nurse, learning disability used for this call? No    Completed refill call assessment today to schedule patient's medication shipment from the [facility] [phone]).  All relevant notes have been reviewed.     Specialty medication(s) and dose(s) confirmed: Regimen is correct and unchanged.   Changes to medications: Lashae reports no changes at this time.  Changes to insurance: No  New side effects reported not previously addressed with a pharmacist or physician: None reported  Questions for the pharmacist: No    Confirmed patient received a Conservation officer, historic buildings and a Surveyor, mining with first shipment. The patient will receive a drug information handout for each medication shipped and additional FDA Medication Guides as required.       DISEASE/MEDICATION-SPECIFIC INFORMATION        For patients on injectable medications: Patient currently has 0 doses left.  Next injection is scheduled for 07/24/2022.    SPECIALTY MEDICATION ADHERENCE     Medication Adherence    Patient reported X missed doses in the last month: 0  Specialty Medication: DUPIXENT PEN 300 mg/2 mL Pnij (dupilumab)  Patient is on additional specialty medications: No              Were doses missed due to medication being on hold? No    Dupixent 300/2 mg/ml: 0 days of medicine on hand     REFERRAL TO PHARMACIST     Referral to the pharmacist: Not needed      [facility]     Shipping address confirmed in Epic.       Delivery Scheduled: Yes, Expected medication delivery date: 07/20/2022.     Medication will be delivered via Same Day Courier to the prescription address in Epic WAM.    Dorisann Frames   [facility] Specialty Technician

## 2022-07-20 MED FILL — DUPIXENT 300 MG/2 ML SUBCUTANEOUS PEN INJECTOR: SUBCUTANEOUS | 28 days supply | Qty: 4 | Fill #4

## 2022-07-24 ENCOUNTER — Ambulatory Visit: Admit: 2022-07-24 | Discharge: 2022-07-25 | Payer: MEDICARE

## 2022-07-24 DIAGNOSIS — J3089 Other allergic rhinitis: Principal | ICD-10-CM

## 2022-07-24 DIAGNOSIS — L209 Atopic dermatitis, unspecified: Principal | ICD-10-CM

## 2022-07-24 DIAGNOSIS — J4521 Mild intermittent asthma with (acute) exacerbation: Principal | ICD-10-CM

## 2022-07-24 NOTE — Unmapped (Signed)
Allergy/Immunology   Clinic Note       Reason for visit: eczema, asthma, rhinitis    Assessment and Plan:   Diagnoses:  1. Atopic dermatitis, unspecified type    2. Seasonal allergic rhinitis due to other allergic trigger    3. Mild intermittent reactive airway disease with acute exacerbation      Assessment and Plan:  Ms. Allery is a 72 y.o. female seen for the following:    Allergic Rhinitis (nasal allergies): The history is consistent with both seasonal and perennial allergies. Blood testing confirmed sensitivity to mold.   Medications are as described below.   - Flonase nasal spray, 2  sprays per nostril BID as prescribed by ENT. Astelin 1 spay daily. Nasal sprays only work if used daily. Remember to tilt your head down, look at the floor, point the spray to the outside wall of your nose (towards the same-side ear) and spray. If you have to sniff, sniff gently. Do not sniff hard, you want the medication to stay in your nose. Take medications year-round.   -try stopping Singulair to see if you need this  - Cetirizine (Zyrtec) 10 mg daily as needed if still having symptoms   - Nasal saline rinses on a daily or twice daily basis using the sinus rinse kit.   - For ocular symptoms, rinse eyes with chilled artificial tears, followed by ketotifen (Alaway/Zaditor) eye drops (1 drop per eye up to 3x daily as needed.)      Atopic Dermatitis:  Yarrow Mcintire has moderate atopic dermatitis, which appears to be well controlled.   - treat eczema flares on the body with triamcinolone 0.1% ointment  and eczema flares on the face (or mild flares) with hydrocortisone 2.5% ointment , 2 times a day for 5-7 days to affected areas on body  - daily baths in warm water with moisturizing soon after bathing.  Minimize soap use, and use instead moisturizing bars, like those made by [employer] [place], Aveeno, Cetaphil, Cerave.   - use unscented thick moisturizer ointment/cream that comes in a tub, like petroleum jelly / Vaseline, Aquaphor, Cerave Healing Ointment (not the renewing cream),  or Cetaphil   - avoid lotions and liquid soaps as they can be drying.  Avoid scrubbing skin with washcloths/loofahs.    - Continue Dupixent 300mg  q2 weeks. Again, discussed that we can try stopping or spacing out to see if she needs this given she is now well controlled. She does not want to do this and really wants to stay on q2 weeks (she asks if long term risk on Dupi, discussed we are unaware of any long term side effects at this time).     Asthma, mild intermittent  Good control, follows with pulmonology. Continue Symbicort PRN. Also on Dupixent a prescribed by pulmonology. Discussed that as above, we can try weaning this given eczema and asthma are under good control, but she does not want to at this time.    Amoxicillin allergy  Low risk based on history (benign rash >5 years ago). Will proceed with 10-90 amox challenge as a nursing visit. Will be off of anti histamines for 5 days prior.    Follow-up:  Return in about 6 months (around 01/24/2023).    Patient discussed with the attending, Dr. Craige Cotta, with whom the above assessment and plan were jointly formulated.    --  Melbourne Abts, MD  Allergy/Immunology Fellow   [facility]    I personally spent 30 minutes  face-to-face and non-face-to-face in the care of this patient, which includes all pre, intra, and post visit time on the date of service.    Subjective:   HPI:  I had the pleasure of seeing [name] in allergy/immunology clinic today, and she is a 72 y.o. female w/ a hx of hypertension, hyperlipidemia, CKD stage III, mild intermittent reactive airway disease, chronic cough, prediabetes, GERD, former tobacco use Seen for further evaluation of eczema, allergic rhinitis. A thorough review of the available medical records is also performed.    Established care with me 07/04/2021. HPI at that visit:  Atopic dermatitis for 40 years. Unclear triggers. All year round. On arms, hands, legs, abdomen. Every month would flare once before Dupixent. She would use triamcinolone 0.1% ointment which would help for 1 week. Rare flares since Dupixent. Did not use loofas. Used non scented moisturizing bars. No skin infections. Given mild asthma, eczema pulm at [facility] started her on Dupixent. Has seen OSH dermatology 3/28 to establish care. Uses a moisturizer daily to BID (Vaseline or Aveeno).     Rhinitis. Patient with symptoms of nasal congestion, itching, post nasal drip all year round, worse March to May, August to October. Symptoms triggered by cold, strong scents. No pets. Have tried: Zyrtec 10 mg, Flonase 2 sprays BID, Astelin 1 spray, Singulair 10mg , sinus rinse once a day. Well controlled on this regimen, but still with some symptoms. Since starting Dupixent has had improvement. Follows with ENT here, last seen 03/2021. Not interested in immunotherapy. Tested positive to mold by blood (ordered by pulm). Also sees ENT for rhinitis. On Flonase 2 sprays BID and nasal irrigations BID.    History of asthma dx last year. Hospitalized August 2022 for asthma (wheezing, difficulty breathing), rapidly improved with steroids and nebulizer. Leading up to this admission, had 6 months of cough and sinus infections. Had multipe rounds of pred/antibiotics. At dischare told to take [facility] and combivent, but was so improved stopped it. Established care with pulm and is on PRN Symbicort and Dupixent given allergies (started 03/2021). Rare Symbicort use. Improvement w/ Dupixent.    Saw me again 12/2021:  -doing great from an atopic standpoint. On Flonase 2 sprays BID and nasal irrigation BID with minimal rhinitis. Also takes Zyrtec, Singulair daily.    -amoxcillin allergy: doesn't remember, but many years ago had a rash, tiny itchy bumps all over her body. Not sure what was being treated for or how long it lasted for. Maybe lasted for like 1 week. Used cortisone cream. Interested in delabeling. Plan to schedule 10-90 and she agreed.    -asthma was under great control, rare Symbicort use. No cough, SOB, wheezing, chest tightness    -eczema was under good control, rare use of TC since starting Dupi. Will see derm tomorrow. She will consider stopping Dupi after the holidays or spacing it out.    Interval history:  -AR: 2 sprays of Flonase once a day, zyrtec 10mg  during the spring. Dupixent helps with this as well  -eczema: under great control, no flares, on Dupi 300mg  every 2 weeks. Decided she does not want to space out or stop  -asthma: symbicort as needed, may need it once a month for some SOB (triggered by rain, pollen). Does not feel limited by asthma. No steroids needed past year. No cough.    Review of Systems:  As per HPI, all other systems reviewed are negative.    Past Medical History:     Past Medical History:  Diagnosis Date    Carpal tunnel syndrome     CKD (chronic kidney disease)     Eczema     Gastroesophageal reflux     Hypertension     Seasonal allergies     Type 2 diabetes mellitus without complication, without long-term current use of insulin (CMS-HCC) 07/26/2021       Past Surgical History:   Procedure Laterality Date    CARPAL TUNNEL RELEASE Right     HYSTERECTOMY         Medications:     Current Outpatient Medications   Medication Sig Dispense Refill    ACCU-CHEK GUIDE TEST STRIPS Strp       albuterol HFA 90 mcg/actuation inhaler Inhale 2 puffs every six (6) hours as needed for wheezing or shortness of breath. 8 g 0    alendronate (FOSAMAX) 70 MG tablet TAKE 1 TABLET BY MOUTH ONCE A WEEK      amLODIPine (NORVASC) 5 MG tablet Take 1 tablet (5 mg total) by mouth daily.      azelastine (ASTELIN) 137 mcg (0.1 %) nasal spray PLACE 1 SPRAY INTO BOTH NOSTRILS 2 (TWO) TIMES DAILY      budesonide-formoteroL (SYMBICORT) 80-4.5 mcg/actuation inhaler Inhale 2 puffs Two (2) times a day. 10.2 g 11    buPROPion (WELLBUTRIN XL) 150 MG 24 hr tablet Take 1 tablet (150 mg total) by mouth daily.      calcium carbonate-vitamin D3 (CALCIUM 600 WITH VITAMIN D3) 600 mg(1,500mg ) -400 unit cap Take 1 capsule by mouth once daily. 30 each 0    cetirizine (ZYRTEC) 10 MG tablet Take 1 tablet (10 mg total) by mouth daily.      dupilumab (DUPIXENT PEN) 300 mg/2 mL PnIj Inject the contents of 1 pen (300 mg total) under the skin every fourteen (14) days. 4 mL 11    empty container Misc Use as directed 1 each 0    EPINEPHrine (EPIPEN) 0.3 mg/0.3 mL injection Inject 0.3 mL (0.3 mg total) into the muscle once as needed for anaphylaxis. 1 each 1    fluticasone propionate (FLONASE) 50 mcg/actuation nasal spray 2 sprays into each nostril two (2) times a day. 32 g= 1 month supply 32 g 11    losartan (COZAAR) 50 MG tablet Take 0.5 tablets (25 mg total) by mouth in the morning for 20 days. 10 tablet 0    lovastatin (MEVACOR) 40 MG tablet Take 1 tablet (40 mg total) by mouth daily with evening meal.      metFORMIN (GLUCOPHAGE-XR) 500 MG 24 hr tablet TAKE 1 TABLET BY MOUTH EVERY DAY WITH DINNER      metoprolol succinate (TOPROL-XL) 25 MG 24 hr tablet Take 1 tablet (25 mg total) by mouth in the morning.      montelukast (SINGULAIR) 10 mg tablet Take 1 tablet (10 mg total) by mouth.      multivitamin-Ca-iron-minerals Tab Take 1 tablet by mouth daily.      omeprazole (PRILOSEC) 20 MG capsule Take 1 capsule (20 mg total) by mouth in the morning.      traZODone (DESYREL) 50 MG tablet       triamcinolone (KENALOG) 0.1 % cream        No current facility-administered medications for this visit.       Allergies:     Allergies   Allergen Reactions    Sulfamethoxazole      Other reaction(s): RASH    Azithromycin Rash    Zolpidem Other (See Comments)  sleepwalking  Other reaction(s): Other (See Comments)  sleepwalking  Other reaction(s): Other (See Comments)  sleepwalking      Amoxicillin Rash    Atorvastatin Nausea Only       Family History:     Family History   Problem Relation Age of Onset    COPD Neg Hx     Lung cancer Neg Hx     Asthma Neg Hx        Social History:     Social History     Socioeconomic History    Marital status: Married   Tobacco Use    Smoking status: Former     Types: Cigarettes     Passive exposure: Current    Smokeless tobacco: Former     Types: Snuff   Vaping Use    Vaping status: Never Used   Substance and Sexual Activity    Alcohol use: Yes     Alcohol/week: 2.0 standard drinks of alcohol     Types: 2 Glasses of wine per week     Comment: 2 drinks weekly    Drug use: No    Sexual activity: Not Currently     Social Determinants of Health     Financial Resource Strain: Low Risk  (10/23/2020)    Overall Financial Resource Strain (CARDIA)     Difficulty of Paying Living Expenses: Not hard at all   Food Insecurity: No Food Insecurity (10/23/2020)    Hunger Vital Sign     Worried About Running Out of Food in the Last Year: Never true     Ran Out of Food in the Last Year: Never true   Transportation Needs: No Transportation Needs (10/23/2020)    PRAPARE - Therapist, art (Medical): No     Lack of Transportation (Non-Medical): No   Physical Activity: Insufficiently Active (03/14/2017)    Received from [facility]    Exercise Vital Sign     Days of Exercise per Week: 3 days     Minutes of Exercise per Session: 30 min   Stress: No Stress Concern Present (03/14/2017)    Received from [facility] - Occupational Stress Questionnaire     Feeling of Stress : Only a little   Social Connections: Socially Integrated (03/14/2017)    Received from [facility]    Social Connection and Isolation Panel [NHANES]     Frequency of Communication with Friends and Family: More than three times a week     Frequency of Social Gatherings with Friends and Family: Not asked     Attends Religious Services: More than 4 times per year     Active Member of [employer] or Organizations: Yes     Attends Engineer, structural: More than 4 times per year     Marital Status: Married Objective:   PE:   Vitals:    07/24/22 1517   BP: 150/99   BP Site: L Arm   BP Position: Sitting   Pulse: 83   SpO2: 99%       General:  Well nourished female in no acute distress without toxic appearance.  Skin:  No eczematous patches or rash. No dermatographism.  HEENT:  No conjunctival injection, non-prominent infraorbital folds; Anterior nasal examination: pink nasal mucosa, non-edematous inferior turbinates, no nasal septal deviation or visible polyps; granular oropharynx without exudates, ulcerations, or thrush;   CV: Regular  rhythm; normal S1 and S2; no murmur, gallop or rub.  Respiratory:  Adequate inspiratory effort. Clear to auscultation bilaterally. No wheezes, crackles, or rhonchi.  Neurologic:  Alert and mental status appropriate for age; normal gait.  Musculoskeletal:  No peripheral edema. Normal bulk for age  Psychiatric: Relaxed, friendly, and cooperative with interview and examination. Euthymic affect. Normal thought process and thought content.    Investigations    Laboratory testing reviewed and pertinent for the following:    No visits with results within 4 Week(s) from this visit.   Latest known visit with results is:   Office Visit on 10/26/2021   Component Date Value    IgE, Total 10/26/2021 58.9

## 2022-07-24 NOTE — Unmapped (Signed)
-  continue dupixent 300mg  every 2 weeks for now; I recommend trying to space it out every 3-4 weeks to see how you do (let me know if interested in the future). No worries if you're not ready!    -continue flonase and zyrtec 10mg  for seasonal allergies    -continue as needed symbicort inhaler

## 2022-07-25 ENCOUNTER — Ambulatory Visit
Admission: RE | Admit: 2022-07-25 | Discharge: 2022-07-25 | Disposition: A | Discharge status: Home / Self Care | Payer: 59 | Source: Ambulatory Visit | Attending: Gerontology | Admitting: Gerontology

## 2022-07-25 DIAGNOSIS — Z1231 Encounter for screening mammogram for malignant neoplasm of breast: Secondary | ICD-10-CM | POA: Diagnosis not present

## 2022-07-25 NOTE — Unmapped (Signed)
Addended by: Mahlon Gammon on: 07/25/2022 09:52 AM     Modules accepted: Level of Service

## 2022-07-27 ENCOUNTER — Other Ambulatory Visit: Payer: Self-pay | Admitting: Gerontology

## 2022-07-27 DIAGNOSIS — R921 Mammographic calcification found on diagnostic imaging of breast: Secondary | ICD-10-CM

## 2022-07-27 DIAGNOSIS — R928 Other abnormal and inconclusive findings on diagnostic imaging of breast: Secondary | ICD-10-CM

## 2022-07-28 ENCOUNTER — Ambulatory Visit
Admission: RE | Admit: 2022-07-28 | Discharge: 2022-07-28 | Disposition: A | Discharge status: Home / Self Care | Payer: 59 | Source: Ambulatory Visit | Attending: Gerontology | Admitting: Gerontology

## 2022-07-28 ENCOUNTER — Encounter: Payer: Self-pay | Admitting: Gerontology

## 2022-07-28 DIAGNOSIS — R928 Other abnormal and inconclusive findings on diagnostic imaging of breast: Secondary | ICD-10-CM | POA: Diagnosis present

## 2022-07-28 DIAGNOSIS — R921 Mammographic calcification found on diagnostic imaging of breast: Secondary | ICD-10-CM | POA: Insufficient documentation

## 2022-08-02 ENCOUNTER — Other Ambulatory Visit: Payer: Self-pay | Admitting: Gerontology

## 2022-08-02 DIAGNOSIS — R928 Other abnormal and inconclusive findings on diagnostic imaging of breast: Secondary | ICD-10-CM

## 2022-08-07 ENCOUNTER — Ambulatory Visit
Admission: RE | Admit: 2022-08-07 | Discharge: 2022-08-07 | Disposition: A | Discharge status: Home / Self Care | Payer: 59 | Source: Ambulatory Visit | Attending: Gerontology | Admitting: Gerontology

## 2022-08-07 DIAGNOSIS — R928 Other abnormal and inconclusive findings on diagnostic imaging of breast: Secondary | ICD-10-CM | POA: Diagnosis present

## 2022-08-07 HISTORY — PX: BREAST BIOPSY: SHX20

## 2022-08-07 MED ORDER — LIDOCAINE-EPINEPHRINE 1 %-1:100000 IJ SOLN
20.0000 mL | Freq: Once | INTRAMUSCULAR | Status: AC
  Administered 2022-08-07: 20 mL

## 2022-08-07 MED ORDER — LIDOCAINE HCL (PF) 1 % IJ SOLN
5.0000 mL | Freq: Once | INTRAMUSCULAR | Status: AC
  Administered 2022-08-07: 5 mL

## 2022-08-07 NOTE — Unmapped (Signed)
[facility] Refill Coordination Note    Specialty Medication(s) to be Shipped:   CF/Pulmonary/Asthma: Dupixent 300mg /56ml    Other medication(s) to be shipped: No additional medications requested for fill at this time     Jasmine Carson, DOB: [birth year]  Phone: [phone] (home)       All above HIPAA information was verified with patient.     Was a Nurse, learning disability used for this call? No    Completed refill call assessment today to schedule patient's medication shipment from the [facility] [phone]).  All relevant notes have been reviewed.     Specialty medication(s) and dose(s) confirmed: Regimen is correct and unchanged.   Changes to medications: Jasmine Carson reports no changes at this time.  Changes to insurance: No  New side effects reported not previously addressed with a pharmacist or physician: None reported  Questions for the pharmacist: No    Confirmed patient received a Conservation officer, historic buildings and a Surveyor, mining with first shipment. The patient will receive a drug information handout for each medication shipped and additional FDA Medication Guides as required.       DISEASE/MEDICATION-SPECIFIC INFORMATION        For patients on injectable medications: Patient currently has 0 doses left.  Next injection is scheduled for 08/21/22.    SPECIALTY MEDICATION ADHERENCE     Medication Adherence    Patient reported X missed doses in the last month: 0  Specialty Medication: DUPIXENT PEN 300 mg/2 mL Pnij (dupilumab)  Patient is on additional specialty medications: No  Patient is on more than two specialty medications: No  Any gaps in refill history greater than 2 weeks in the last 3 months: no  Demonstrates understanding of importance of adherence: yes  Informant: patient  Reliability of informant: reliable  Provider-estimated medication adherence level: good  Patient is at risk for Non-Adherence: No  Reasons for non-adherence: no problems identified              Were doses missed due to medication being on hold? No    DUPIXENT PEN 300 mg/2 mL Pnij (dupilumab)   : 0 days of medicine on hand       REFERRAL TO PHARMACIST     Referral to the pharmacist: Not needed      [facility]     Shipping address confirmed in Epic.       Delivery Scheduled: Yes, Expected medication delivery date: 08/16/22.     Medication will be delivered via Same Day Courier to the prescription address in Epic WAM.    Jasmine Carson' W Danae Chen [facility] Specialty Technician

## 2022-08-08 LAB — SURGICAL PATHOLOGY

## 2022-08-16 MED FILL — DUPIXENT 300 MG/2 ML SUBCUTANEOUS PEN INJECTOR: SUBCUTANEOUS | 28 days supply | Qty: 4 | Fill #5

## 2022-09-08 NOTE — Unmapped (Signed)
[facility] Refill Coordination Note    Specialty Medication(s) to be Shipped:   CF/Pulmonary/Asthma: Dupixent 300mg /58ml    Other medication(s) to be shipped: No additional medications requested for fill at this time     Jasmine Carson, DOB: [birth year]  Phone: [phone] (home)       All above HIPAA information was verified with patient.     Was a Nurse, learning disability used for this call? No    Completed refill call assessment today to schedule patient's medication shipment from the [facility] [phone]).  All relevant notes have been reviewed.     Specialty medication(s) and dose(s) confirmed: Regimen is correct and unchanged.   Changes to medications: Jatziry reports no changes at this time.  Changes to insurance: No  New side effects reported not previously addressed with a pharmacist or physician: None reported  Questions for the pharmacist: No    Confirmed patient received a Conservation officer, historic buildings and a Surveyor, mining with first shipment. The patient will receive a drug information handout for each medication shipped and additional FDA Medication Guides as required.       DISEASE/MEDICATION-SPECIFIC INFORMATION        For patients on injectable medications: Patient currently has 0 doses left.  Next injection is scheduled for 09/18/22.    SPECIALTY MEDICATION ADHERENCE     Medication Adherence    Patient reported X missed doses in the last month: 0  Specialty Medication: DUPIXENT PEN 300 mg/2 mL Pnij (dupilumab)  Patient is on additional specialty medications: No  Patient is on more than two specialty medications: No  Any gaps in refill history greater than 2 weeks in the last 3 months: no  Demonstrates understanding of importance of adherence: yes              Were doses missed due to medication being on hold? No    DUPIXENT PEN 300 mg/2 mL Pnij (dupilumab)   : 0 days of medicine on hand       REFERRAL TO PHARMACIST     Referral to the pharmacist: Not needed      [facility] Shipping address confirmed in Epic.       Delivery Scheduled: Yes, Expected medication delivery date: 09/15/22.     Medication will be delivered via Same Day Courier to the prescription address in Epic WAM.    Ricci Barker   [facility] Specialty Technician

## 2022-09-15 MED FILL — DUPIXENT 300 MG/2 ML SUBCUTANEOUS PEN INJECTOR: SUBCUTANEOUS | 28 days supply | Qty: 4 | Fill #6

## 2022-10-03 NOTE — Unmapped (Signed)
[facility] Refill Coordination Note    Specialty Medication(s) to be Shipped:   CF/Pulmonary/Asthma: Dupixent 300    Other medication(s) to be shipped: No additional medications requested for fill at this time     Jasmine Carson, DOB: 20-Dec-1950  Phone: [phone] (home)       All above HIPAA information was verified with patient.     Was a Nurse, learning disability used for this call? No    Completed refill call assessment today to schedule patient's medication shipment from the [facility] [phone]).  All relevant notes have been reviewed.     Specialty medication(s) and dose(s) confirmed: Regimen is correct and unchanged.   Changes to medications: Jamea reports no changes at this time.  Changes to insurance: No  New side effects reported not previously addressed with a pharmacist or physician: None reported  Questions for the pharmacist: No    Confirmed patient received a Conservation officer, historic buildings and a Surveyor, mining with first shipment. The patient will receive a drug information handout for each medication shipped and additional FDA Medication Guides as required.       DISEASE/MEDICATION-SPECIFIC INFORMATION        For patients on injectable medications: Patient currently has 0 doses left.  Next injection is scheduled for 10/09/22.    SPECIALTY MEDICATION ADHERENCE     Medication Adherence    Patient reported X missed doses in the last month: 0  Specialty Medication: DUPIXENT PEN 300 mg/2 mL Pnij (dupilumab)  Patient is on additional specialty medications: No  Patient is on more than two specialty medications: No  Any gaps in refill history greater than 2 weeks in the last 3 months: no  Demonstrates understanding of importance of adherence: yes              Were doses missed due to medication being on hold? No    DUPIXENT PEN 300   mg/ml: 0 days of medicine on hand       REFERRAL TO PHARMACIST     Referral to the pharmacist: Not needed      [facility]     Shipping address confirmed in Epic.       Delivery Scheduled: Yes, Expected medication delivery date: 10/09/22.     Medication will be delivered via Same Day Courier to the prescription address in Epic WAM.    Jasmine Carson   [facility] Specialty Technician

## 2022-10-09 MED FILL — DUPIXENT 300 MG/2 ML SUBCUTANEOUS PEN INJECTOR: SUBCUTANEOUS | 28 days supply | Qty: 4 | Fill #7

## 2022-10-30 NOTE — Unmapped (Signed)
[facility] Refill Coordination Note    Specialty Medication(s) to be Shipped:   CF/Pulmonary/Asthma: DUPIXENT PEN 300 mg/2 mL Pnij (dupilumab)    Other medication(s) to be shipped: No additional medications requested for fill at this time     Jasmine Carson, DOB: 13-Aug-1950  Phone: [phone] (home)       All above HIPAA information was verified with patient.     Was a Nurse, learning disability used for this call? No    Completed refill call assessment today to schedule patient's medication shipment from the [facility] [phone]).  All relevant notes have been reviewed.     Specialty medication(s) and dose(s) confirmed: Regimen is correct and unchanged.   Changes to medications: Jasmine Carson reports no changes at this time.  Changes to insurance: No  New side effects reported not previously addressed with a pharmacist or physician: None reported  Questions for the pharmacist: No    Confirmed patient received a Conservation officer, historic buildings and a Surveyor, mining with first shipment. The patient will receive a drug information handout for each medication shipped and additional FDA Medication Guides as required.       DISEASE/MEDICATION-SPECIFIC INFORMATION        For patients on injectable medications: Patient currently has 0 doses left.  Next injection is scheduled for 11/13/22.    SPECIALTY MEDICATION ADHERENCE     Medication Adherence    Patient reported X missed doses in the last month: 0  Specialty Medication: DUPIXENT PEN 300 mg/2 mL Pnij (dupilumab)  Patient is on additional specialty medications: No  Patient is on more than two specialty medications: No  Any gaps in refill history greater than 2 weeks in the last 3 months: no  Demonstrates understanding of importance of adherence: yes  Informant: patient  Reliability of informant: reliable  Provider-estimated medication adherence level: good  Patient is at risk for Non-Adherence: No  Reasons for non-adherence: no problems identified Were doses missed due to medication being on hold? No    DUPIXENT PEN 300 mg/2 mL Pnij (dupilumab)   : 0 days of medicine on hand       REFERRAL TO PHARMACIST     Referral to the pharmacist: Not needed      [facility]     Shipping address confirmed in Epic.       Delivery Scheduled: Yes, Expected medication delivery date: 11/09/22.     Medication will be delivered via Same Day Courier to the prescription address in Epic WAM.    Jasmine Carson' W Jasmine [facility] Shared [facility] Specialty Technician

## 2022-11-09 MED FILL — DUPIXENT 300 MG/2 ML SUBCUTANEOUS PEN INJECTOR: SUBCUTANEOUS | 28 days supply | Qty: 4 | Fill #8

## 2022-12-04 NOTE — Unmapped (Signed)
[facility] Refill Coordination Note    Specialty Medication(s) to be Shipped:   CF/Pulmonary/Asthma: Dupixent 300mg /35ml    Other medication(s) to be shipped: No additional medications requested for fill at this time     Jasmine Carson, DOB: May 30, 1950  Phone: [phone] (home)       All above HIPAA information was verified with patient.     Was a Nurse, learning disability used for this call? No    Completed refill call assessment today to schedule patient's medication shipment from the [facility] [phone]).  All relevant notes have been reviewed.     Specialty medication(s) and dose(s) confirmed: Regimen is correct and unchanged.   Changes to medications: Charleigh reports no changes at this time.  Changes to insurance: No  New side effects reported not previously addressed with a pharmacist or physician: None reported  Questions for the pharmacist: No    Confirmed patient received a Conservation officer, historic buildings and a Surveyor, mining with first shipment. The patient will receive a drug information handout for each medication shipped and additional FDA Medication Guides as required.       DISEASE/MEDICATION-SPECIFIC INFORMATION        For patients on injectable medications: Patient currently has 0 doses left.  Next injection is scheduled for 12/11/22.    SPECIALTY MEDICATION ADHERENCE     Medication Adherence    Patient reported X missed doses in the last month: 0  Specialty Medication: Dupixent 300 mg/58mL Q14d  Patient is on additional specialty medications: No  Patient is on more than two specialty medications: No  Any gaps in refill history greater than 2 weeks in the last 3 months: no  Demonstrates understanding of importance of adherence: yes              Were doses missed due to medication being on hold? No    DUPIXENT PEN 300 mg/2 mL Pnij (dupilumab)   : 0 days of medicine on hand       REFERRAL TO PHARMACIST     Referral to the pharmacist: Not needed      [facility]     Shipping address confirmed in Epic.       Delivery Scheduled: Yes, Expected medication delivery date: 12/08/22.     Medication will be delivered via Same Day Courier to the prescription address in Epic WAM.    Ricci Barker   [facility] Specialty Technician

## 2022-12-08 MED FILL — DUPIXENT 300 MG/2 ML SUBCUTANEOUS PEN INJECTOR: SUBCUTANEOUS | 28 days supply | Qty: 4 | Fill #9

## 2022-12-28 NOTE — Unmapped (Signed)
[facility] Specialty and Home Delivery Pharmacy Refill Coordination Note    Specialty Medication(s) to be Shipped:   CF/Pulmonary/Asthma: DUPIXENT PEN 300 mg/2 mL Pnij (dupilumab)    Other medication(s) to be shipped: No additional medications requested for fill at this time     Jasmine Carson, DOB: [birth year]  Phone: [phone] (home)       All above HIPAA information was verified with patient.     Was a Nurse, learning disability used for this call? No    Completed refill call assessment today to schedule patient's medication shipment from the [facility] Delivery Pharm[facility]  [phone]).  All relevant notes have been reviewed.     Specialty medication(s) and dose(s) confirmed: Regimen is correct and unchanged.   Changes to medications: Jasmine Carson reports no changes at this time.  Changes to insurance: No  New side effects reported not previously addressed with a pharmacist or physician: None reported  Questions for the pharmacist: No    Confirmed patient received a Conservation officer, historic buildings and a Surveyor, mining with first shipment. The patient will receive a drug information handout for each medication shipped and additional FDA Medication Guides as required.       DISEASE/MEDICATION-SPECIFIC INFORMATION        For patients on injectable medications: Patient currently has 0 doses left.  Next injection is scheduled for 01/08/23.    SPECIALTY MEDICATION ADHERENCE     Medication Adherence    Patient reported X missed doses in the last month: 0  Specialty Medication: DUPIXENT PEN 300 mg/2 mL Pnij (dupilumab)  Patient is on additional specialty medications: No  Patient is on more than two specialty medications: No  Any gaps in refill history greater than 2 weeks in the last 3 months: no  Demonstrates understanding of importance of adherence: yes  Informant: patient  Reliability of informant: reliable  Provider-estimated medication adherence level: good  Patient is at risk for Non-Adherence: No  Reasons for non-adherence: no problems identified              Were doses missed due to medication being on hold? No    DUPIXENT PEN 300 mg/2 mL Pnij (dupilumab)  : 0 doses of medicine on hand       REFERRAL TO PHARMACIST     Referral to the pharmacist: Not needed      SHIPPING     Shipping address confirmed in Epic.       Delivery Scheduled: Yes, Expected medication delivery date: 01/03/23.     Medication will be delivered via Same Day Courier to the prescription address in Epic WAM.    [facility]  Specialty Technician

## 2023-01-03 MED FILL — DUPIXENT 300 MG/2 ML SUBCUTANEOUS PEN INJECTOR: SUBCUTANEOUS | 28 days supply | Qty: 4 | Fill #10

## 2023-01-30 NOTE — Unmapped (Signed)
[facility] Specialty and Home Delivery Pharmacy Refill Coordination Note    Specialty Medication(s) to be Shipped:   Inflammatory Disorders: Dupixent    Other medication(s) to be shipped: No additional medications requested for fill at this time     Jasmine Carson, DOB: [birth year]  Phone: [phone] (home)       All above HIPAA information was verified with patient.     Was a Nurse, learning disability used for this call? No    Completed refill call assessment today to schedule patient's medication shipment from the [facility] and Home Delivery Pharmacy  [phone]).  All relevant notes have been reviewed.     Specialty medication(s) and dose(s) confirmed: Regimen is correct and unchanged.   Changes to medications: [name] reports no changes at this time.  Changes to insurance: No  New side effects reported not previously addressed with a pharmacist or physician: None reported  Questions for the pharmacist: No    Confirmed patient received a Conservation officer, historic buildings and a Surveyor, mining with first shipment. The patient will receive a drug information handout for each medication shipped and additional FDA Medication Guides as required.       DISEASE/MEDICATION-SPECIFIC INFORMATION        For patients on injectable medications: Patient currently has 0 doses left.  Next injection is scheduled for 11/14.    SPECIALTY MEDICATION ADHERENCE     Medication Adherence    Patient reported X missed doses in the last month: 0  Specialty Medication: DUPIXENT PEN 300 mg/2 mL Pnij (dupilumab)  Patient is on additional specialty medications: No  Patient is on more than two specialty medications: No  Any gaps in refill history greater than 2 weeks in the last 3 months: no  Demonstrates understanding of importance of adherence: yes  Informant: patient  Reliability of informant: reliable  Provider-estimated medication adherence level: good  Patient is at risk for Non-Adherence: No  Reasons for non-adherence: no problems identified  Confirmed plan for next specialty medication refill: delivery by pharmacy  Refills needed for supportive medications: not needed          Refill Coordination    Has the Patients' Contact Information Changed: No  Is the Shipping Address Different: No         Were doses missed due to medication being on hold? No    Dupixent  300/2 mg/ml: 0 days of medicine on hand      REFERRAL TO PHARMACIST     Referral to the pharmacist: Not needed      [facility]     Shipping address confirmed in Epic.       Delivery Scheduled: Yes, Expected medication delivery date: 11/14.     Medication will be delivered via Same Day Courier to the prescription address in Epic WAM.    [name]   [facility] Special[facility] and Home Delivery Pharm[facility]  Specialty Technician

## 2023-02-01 MED FILL — DUPIXENT 300 MG/2 ML SUBCUTANEOUS PEN INJECTOR: SUBCUTANEOUS | 28 days supply | Qty: 4 | Fill #11

## 2023-02-23 DIAGNOSIS — L209 Atopic dermatitis, unspecified: Principal | ICD-10-CM

## 2023-02-23 DIAGNOSIS — Z889 Allergy status to unspecified drugs, medicaments and biological substances status: Principal | ICD-10-CM

## 2023-02-23 DIAGNOSIS — J4521 Mild intermittent asthma with (acute) exacerbation: Principal | ICD-10-CM

## 2023-02-23 MED ORDER — DUPIXENT 300 MG/2 ML SUBCUTANEOUS PEN INJECTOR
SUBCUTANEOUS | 11 refills | 28 days
Start: 2023-02-23 — End: 2024-02-23

## 2023-02-24 NOTE — Unmapped (Signed)
[facility] Specialty and Home Delivery Pharmacy Refill Coordination Note    Specialty Medication(s) to be Shipped:   CF/Pulmonary/Asthma: DUPIXENT PEN 300 mg/2 mL Pnij (dupilumab)    Other medication(s) to be shipped: No additional medications requested for fill at this time     Jasmine Carson, DOB: 28-Nov-1950  Phone: [phone] (home)       All above HIPAA information was verified with patient.     Was a Nurse, learning disability used for this call? No    Completed refill call assessment today to schedule patient's medication shipment from the [facility] and Home Delivery Pharmacy  [phone]).  All relevant notes have been reviewed.     Specialty medication(s) and dose(s) confirmed: Regimen is correct and unchanged.   Changes to medications: Jasmine Carson reports no changes at this time.  Changes to insurance: No  New side effects reported not previously addressed with a pharmacist or physician: None reported  Questions for the pharmacist: No    Confirmed patient received a Conservation officer, historic buildings and a Surveyor, mining with first shipment. The patient will receive a drug information handout for each medication shipped and additional FDA Medication Guides as required.       DISEASE/MEDICATION-SPECIFIC INFORMATION        For patients on injectable medications: Patient currently has 0 doses left.  Next injection is scheduled for 03/05/23.    SPECIALTY MEDICATION ADHERENCE     Medication Adherence    Patient reported X missed doses in the last month: 3-4  Specialty Medication: DUPIXENT PEN 300 mg/2 mL Pnij (dupilumab)  Patient is on additional specialty medications: No  Patient is on more than two specialty medications: No  Any gaps in refill history greater than 2 weeks in the last 3 months: no  Demonstrates understanding of importance of adherence: yes              Were doses missed due to medication being on hold? No    DUPIXENT PEN 300 mg/2 mL Pnij (dupilumab)  : 0 doses of medicine on hand       REFERRAL TO PHARMACIST     Referral to the pharmacist: Not needed      SHIPPING     Shipping address confirmed in Epic.       Delivery Scheduled: Yes, Expected medication delivery date: 03/01/23 .  However, Rx request for refills was sent to the provider as there are none remaining.     Medication will be delivered via Same Day Courier to the prescription address in Epic WAM.    Jasmine Carson   [facility] Specialty and Home Delivery Pharmacy  Specialty Technician

## 2023-02-25 MED ORDER — DUPIXENT 300 MG/2 ML SUBCUTANEOUS PEN INJECTOR
SUBCUTANEOUS | 11 refills | 28 days | Status: CP
Start: 2023-02-25 — End: 2024-02-25
  Filled 2023-03-01: qty 4, 28d supply, fill #0

## 2023-03-20 NOTE — Unmapped (Addendum)
[facility] Specialty and Home Delivery Pharmacy Refill Coordination Note    Specialty Medication(s) to be Shipped:   CF/Pulmonary/Asthma: DUPIXENT PEN 300 mg/2 mL Pnij (dupilumab)    Other medication(s) to be shipped: No additional medications requested for fill at this time     Jasmine Carson, DOB: 02-Apr-1950  Phone: [phone] (home)       All above HIPAA information was verified with patient.     Was a Nurse, learning disability used for this call? No    Completed refill call assessment today to schedule patient's medication shipment from the [facility] and Home Delivery Pharmacy  [phone]).  All relevant notes have been reviewed.     Specialty medication(s) and dose(s) confirmed: Regimen is correct and unchanged.   Changes to medications: Madason reports no changes at this time.  Changes to insurance: No  New side effects reported not previously addressed with a pharmacist or physician: None reported  Questions for the pharmacist: No    Confirmed patient received a Conservation officer, historic buildings and a Surveyor, mining with first shipment. The patient will receive a drug information handout for each medication shipped and additional FDA Medication Guides as required.       DISEASE/MEDICATION-SPECIFIC INFORMATION        For patients on injectable medications: Patient currently has 0 doses left.  Next injection is scheduled for 03/26/23.    SPECIALTY MEDICATION ADHERENCE     Medication Adherence    Patient reported X missed doses in the last month: 0  Specialty Medication: DUPIXENT PEN 300 mg/2 mL Pnij (dupilumab)  Patient is on additional specialty medications: No  Informant: patient              Were doses missed due to medication being on hold? No    DUPIXENT PEN 300 mg/2 mL Pnij (dupilumab)  : 0 doses of medicine on hand       REFERRAL TO PHARMACIST     Referral to the pharmacist: Not needed      SHIPPING     Shipping address confirmed in Epic.       Delivery Scheduled: Yes, Expected medication delivery date: 03/23/23. Medication will be delivered via Same Day Courier to the prescription address in Epic WAM.    Dan Europe   [facility] Specialty and Home Delivery Pharmacy  Specialty Technician

## 2023-03-23 MED FILL — DUPIXENT 300 MG/2 ML SUBCUTANEOUS PEN INJECTOR: SUBCUTANEOUS | 28 days supply | Qty: 4 | Fill #1

## 2023-04-23 NOTE — Unmapped (Signed)
[facility] Specialty and Home Delivery Pharmacy Refill Coordination Note    Specialty Medication(s) to be Shipped:   Inflammatory Disorders: Dupixent    Other medication(s) to be shipped: No additional medications requested for fill at this time     Jasmine Carson, DOB: [birth year]  Phone: [phone] (home)       All above HIPAA information was verified with patient.     Was a Nurse, learning disability used for this call? No    Completed refill call assessment today to schedule patient's medication shipment from the [facility] and Home Delivery Pharmacy  [phone]).  All relevant notes have been reviewed.     Specialty medication(s) and dose(s) confirmed: Regimen is correct and unchanged.   Changes to medications: Earl reports no changes at this time.  Changes to insurance: No  New side effects reported not previously addressed with a pharmacist or physician: None reported  Questions for the pharmacist: No    Confirmed patient received a Conservation officer, historic buildings and a Surveyor, mining with first shipment. The patient will receive a drug information handout for each medication shipped and additional FDA Medication Guides as required.       DISEASE/MEDICATION-SPECIFIC INFORMATION        For patients on injectable medications: Patient currently has 0 doses left.  Next injection is scheduled for 04/30/23.    SPECIALTY MEDICATION ADHERENCE     Medication Adherence    Patient reported X missed doses in the last month: 0  Specialty Medication: DUPIXENT PEN 300 mg/2 mL Pnij (dupilumab)  Patient is on additional specialty medications: No  Informant: patient              Were doses missed due to medication being on hold? No    Dupixent 300  mg/52ml : 0 doses of medicine on hand       REFERRAL TO PHARMACIST     Referral to the pharmacist: Not needed      [facility]     Shipping address confirmed in Epic.       Delivery Scheduled: Yes, Expected medication delivery date: 04/25/23.     Medication will be delivered via Same Day Courier to the prescription address in Epic WAM.    Jasper Loser   [facility] Specialty and Home Delivery Pharmacy  Specialty Technician

## 2023-04-25 MED FILL — DUPIXENT 300 MG/2 ML SUBCUTANEOUS PEN INJECTOR: SUBCUTANEOUS | 28 days supply | Qty: 4 | Fill #2

## 2023-05-14 ENCOUNTER — Encounter: Payer: Self-pay | Admitting: Occupational Therapy

## 2023-05-14 ENCOUNTER — Ambulatory Visit: Payer: 59 | Attending: Orthopedic Surgery | Admitting: Occupational Therapy

## 2023-05-14 DIAGNOSIS — M25642 Stiffness of left hand, not elsewhere classified: Secondary | ICD-10-CM | POA: Diagnosis present

## 2023-05-14 DIAGNOSIS — R600 Localized edema: Secondary | ICD-10-CM | POA: Diagnosis present

## 2023-05-14 DIAGNOSIS — M79642 Pain in left hand: Secondary | ICD-10-CM | POA: Diagnosis present

## 2023-05-14 DIAGNOSIS — M6281 Muscle weakness (generalized): Secondary | ICD-10-CM | POA: Diagnosis present

## 2023-05-14 NOTE — Therapy (Signed)
 OUTPATIENT OCCUPATIONAL THERAPY ORTHO EVALUATION  Patient Name: Debra Alexander MRN: [id number] DOB:16-Jul-1950, 73 y.o., female Today's Date: 05/14/2023  PCP: Rolan Lipa NP REFERRING PROVIDER: Floyce Stakes PA  END OF SESSION:  OT End of Session - 05/14/23 1540     Visit Number 1    Number of Visits 12    Date for OT Re-Evaluation 06/04/23    OT Start Time 1425    OT Stop Time 1513    OT Time Calculation (min) 48 min    Activity Tolerance Patient tolerated treatment well    Behavior During Therapy [facility] for tasks assessed/performed             Past Medical History:  Diagnosis Date   Hyperlipidemia    Hypertension    Osteoporosis    Past Surgical History:  Procedure Laterality Date   ABDOMINAL HYSTERECTOMY     BREAST BIOPSY Right 08/07/2022   Stereo Bx, X clip- path pending   BREAST BIOPSY Right 08/07/2022   MM RT BREAST BX W LOC DEV 1ST LESION IMAGE BX SPEC STEREO GUIDE 08/07/2022 [facility]   BREAST CYST ASPIRATION Left 2007   neg   There are no active problems to display for this patient.   ONSET DATE: 03/15/23  REFERRING DIAG: Fx of L 4th and 5th MC   THERAPY DIAG:  Stiffness of left hand, not elsewhere classified  Localized edema  Pain in left hand  Muscle weakness (generalized)  Rationale for Evaluation and Treatment: Rehabilitation  SUBJECTIVE:   SUBJECTIVE STATEMENT: I am wearing the splint about 75% of the time and nighttime.  I have been moving my wrist here and they are taking of the splint.  Pain only when I trying to bend my fingers Pt accompanied by: self  PERTINENT HISTORY:  Ortho NOTE [place] 05/09/23: Left hand shows no swelling. No skin breakdown noted. No rotational deformity. She has some stiffness of the fourth and fifth digit but normal range of motion of the thumb index and middle finger. Patient lacks 3 cm of full composite fist of the ring finger, fifth digit she is able to get to 80 degrees of flexion at the MCP joint. Patient is  nontender to palpation along the fracture sites.  AP lateral and oblique views of the left hand are ordered interpreted by me in the office today. Impression: Patient has oblique fractures of the fourth and fifth metacarpal neck's extending into the midshaft. There does not appear to be any intra-articular extension of the fractures. Fifth metacarpal shaft fracture appears to have subtle shortening when compared to previous x-rays. There is subtle increase in callus summation along the fourth and fifth metacarpal shaft. Fracture is not healed at this time.  Impression: Closed displaced fracture of base of fifth metacarpal bone of right hand with routine healing, subsequent encounter [S62.316D] Closed displaced fracture of base of fifth metacarpal bone of right hand with routine healing, subsequent encounter (primary encounter diagnosis) Closed displaced fracture of base of fourth metacarpal bone of left hand with routine healing, subsequent encounter  Plan:  1. Continue with removable Velcro boxer splint. Work on gentle range of motion exercises. Referral placed to hand therapy. Although there is minimal callus on x-ray, patient is nontender on exam. Will continue to protect with splint but also start increasing range of motion exercises 2. Follow-up with Dr. Rosita Kea in 6 weeks for repeat x-rays of the right hand.   PRECAUTIONS: None    WEIGHT BEARING RESTRICTIONS: No  PAIN:  Are you  having pain? 10/10 when trying to move or bend ring and pinkie  FALLS: Has patient fallen in last 6 months? 1  LIVING ENVIRONMENT: Lives with: Son lives with her  PLOF: Had normal active range of motion and strength in left hand.  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  NEXT MD VISIT: 06/14/2023  OBJECTIVE:  Note: Objective measures were completed at Evaluation unless otherwise  noted.  HAND DOMINANCE: Right  ADLs: Wearing splint 75% of the time.  Not able to grip or pick up or pull or push anything with left hand  FUNCTIONAL OUTCOME MEASURES: PRWHE for pain 30/50 and function 36/50  UPPER EXTREMITY ROM:     Active ROM Right eval Left eval  Shoulder flexion    Shoulder abduction    Shoulder adduction    Shoulder extension    Shoulder internal rotation    Shoulder external rotation    Elbow flexion    Elbow extension    Wrist flexion  54  Wrist extension  54  Wrist ulnar deviation  WNL  Wrist radial deviation  WNL  Wrist pronation  WNL  Wrist supination  WNL  (Blank rows = not tested)  Active ROM Right eval Left eval  Thumb MCP (0-60)    Thumb IP (0-80)    Thumb Radial abd/add (0-55)  WNL   Thumb Palmar abd/add (0-45)  WNL   Thumb Opposition to Small Finger   base of 5th   Index MCP (0-90)  70   Index PIP (0-100)   80  Index DIP (0-70)      Long MCP (0-90)    75  Long PIP (0-100)    90  Long DIP (0-70)      Ring MCP (0-90)    40  Ring PIP (0-100)    70  Ring DIP (0-70)      Little MCP (0-90)    40  Little PIP (0-100)    50  Little DIP (0-70)      (Blank rows = not tested)  HAND FUNCTION: Grip strength: Right:   lbs; Left:   lbs, Lateral pinch: Right:   lbs, Left:   lbs, and 3 point pinch: Right:   lbs, Left:   lbs  COORDINATION: Decrease still wearing ulnar gutter splint for protection 75% of the time  SENSATION: Reports no sensation issues  EDEMA: Over ulnar side of hand at fourth and fifth metacarpals  COGNITION: Overall cognitive status: Within functional limits for tasks assessed      TREATMENT DATE: 05/14/23                                                                                                                            Modalities: Fluidotherapy:  Time: 8 Location: Hand and wrist left  Active range of motion for wrist flexion extension as well as digits metacarpal flexion and restrictive fist and  composite fist prior to review of home exercises to decrease stiffness  Patient to do contrast 3 times a day at least followed by wrist flexion extension 12 reps active assisted range of motion Active range of motion blocked MC flexion with extension 12 reps Gentle passive range of motion to DIP and PIP of all digits prior to intrinsic assist blocked 12 reps Followed by active range of motion to 3 cm foam block out of palm 2nd through 4th.  12 reps Slight pull keeping pain under 3/10 Opposition to all digits.       PATIENT EDUCATION: Education details: findings of eval and HEP  Person educated: Patient Education method: Explanation, Demonstration, Tactile cues, Verbal cues, and Handouts Education comprehension: verbalized understanding, returned demonstration, verbal cues required, and needs further education    GOALS: Goals reviewed with patient? Yes   LONG TERM GOALS: Target date: 8 wks  Patient to be independent in home program to increase left wrist active range of motion flexion extension within normal limits Baseline: Patient arrived with a prefab ulnar gutter splint on wrist flexion extension 54 degrees with a slight pain-wearing prefab 75% the time as well as sleeping Goal status: INITIAL  2.  Patient to be independent in home program to decrease edema and pain and increase motion in left hand digits to touch palm symptom-free Baseline: MC flexion in left hand 40 degrees to 75 and PIP 50 to 90 degrees.  With fifth digit worse than fourth.  No knowledge of home program. Goal status: INITIAL  3.  Fabricated patient a custom ulnar gutter splint to allow wrist and DIP PIP AROM. Baseline: Patient has a prefab ulnar gutter splint that immobilizes the wrist as well as all digits on the fourth and fifth.  Wearing at 75% of the time. Goal status: INITIAL  4.  After the patient's next orthopedic visit initiate strengthening to return to within range for her age for grip and  prehension. Baseline: Will assess grip and prehension after next orthopedic visit to confirm healing of fracture site with x-ray-last x-ray did not show callus formation Goal status: INITIAL  ASSESSMENT:  CLINICAL IMPRESSION: Patient seen today for occupational therapy evaluation for left fourth and fifth metacarpal fractures.  Injury was 03/15/2023.  Patient was immobilized in a ulnar gutter splint-prefab that also included wrist and DIP PIP.  Patient arrived with splint on still wearing it 75% of the time and nighttime.  Last orthopedic appointment x-ray showed not a lot of callus formation.  Got a verbal order from PA [employer] [employer] to fabricate next visit hand-based ulnar gutter splint allowing active range of motion at wrist and DIP PIP.  Patient present with increased edema and pain in left hand with decreased range of motion in digits as well as wrist flexion extension and decreased muscle strength.  Patient limited in functional use of left hand in ADLs and IADLs.  Patient can benefit from skilled OT services to decrease edema pain increase motion and strength to return to prior level of function.  PERFORMANCE DEFICITS: in functional skills including ADLs, IADLs, ROM, strength, pain, flexibility, decreased knowledge of use of DME, and UE functional use,   and psychosocial skills including environmental adaptation and routines and behaviors.   IMPAIRMENTS: are limiting patient from ADLs, IADLs, rest and sleep, play, leisure, and social participation.   COMORBIDITIES: has no other co-morbidities that affects  occupational performance. Patient will benefit from skilled OT to address above impairments and improve overall function.  MODIFICATION OR ASSISTANCE TO COMPLETE EVALUATION: No modification of tasks or assist necessary to complete an evaluation.  OT OCCUPATIONAL PROFILE AND HISTORY: Problem focused assessment: Including review of records relating to presenting problem.  CLINICAL DECISION  MAKING: LOW - limited treatment options, no task modification necessary  REHAB POTENTIAL: Good for goals  EVALUATION COMPLEXITY: Low   PLAN:  OT FREQUENCY: 1-2x/week  OT DURATION: 8 weeks  PLANNED INTERVENTIONS: 97168 OT Re-evaluation, 97535 self care/ADL training, 09604 therapeutic exercise, 97530 therapeutic activity, 97112 neuromuscular re-education, 97140 manual therapy, 97018 paraffin, 54098 fluidotherapy, 97034 contrast bath, 97760 Orthotics management and training, 11914 Splinting (initial encounter), passive range of motion, patient/family education, and DME and/or AE instructions    CONSULTED AND AGREED WITH PLAN OF CARE: Patient     Oletta Cohn, OTR/L,CLT 05/14/2023, 3:50 PM

## 2023-05-15 NOTE — Unmapped (Signed)
 [facility] Clinical Assessment & Refill Coordination Note    Jasmine Carson, DOB: 06/26/1950  Phone: [phone] (home)     All above HIPAA information was verified with patient.     Was a Nurse, learning disability used for this call? No    Specialty Medication(s):   CF/Pulmonary/Asthma: Dupixent Pen 300mg /29mL     Current Outpatient Medications   Medication Sig Dispense Refill    ACCU-CHEK GUIDE TEST STRIPS Strp       albuterol HFA 90 mcg/actuation inhaler Inhale 2 puffs every six (6) hours as needed for wheezing or shortness of breath. 8 g 0    alendronate (FOSAMAX) 70 MG tablet TAKE 1 TABLET BY MOUTH ONCE A WEEK      amLODIPine (NORVASC) 5 MG tablet Take 1 tablet (5 mg total) by mouth daily.      azelastine (ASTELIN) 137 mcg (0.1 %) nasal spray PLACE 1 SPRAY INTO BOTH NOSTRILS 2 (TWO) TIMES DAILY      budesonide-formoteroL (SYMBICORT) 80-4.5 mcg/actuation inhaler Inhale 2 puffs Two (2) times a day. 10.2 g 11    buPROPion (WELLBUTRIN XL) 150 MG 24 hr tablet Take 1 tablet (150 mg total) by mouth daily.      calcium carbonate-vitamin D3 (CALCIUM 600 WITH VITAMIN D3) 600 mg(1,500mg ) -400 unit cap Take 1 capsule by mouth once daily. 30 each 0    cetirizine (ZYRTEC) 10 MG tablet Take 1 tablet (10 mg total) by mouth daily.      dupilumab (DUPIXENT PEN) 300 mg/2 mL PnIj Inject the contents of 1 pen (300 mg total) under the skin every fourteen (14) days. 4 mL 11    empty container Misc Use as directed 1 each 0    EPINEPHrine (EPIPEN) 0.3 mg/0.3 mL injection Inject 0.3 mL (0.3 mg total) into the muscle once as needed for anaphylaxis. 1 each 1    fluticasone propionate (FLONASE) 50 mcg/actuation nasal spray 2 sprays into each nostril two (2) times a day. 32 g= 1 month supply 32 g 11    losartan (COZAAR) 50 MG tablet Take 0.5 tablets (25 mg total) by mouth in the morning for 20 days. 10 tablet 0    lovastatin (MEVACOR) 40 MG tablet Take 1 tablet (40 mg total) by mouth daily with evening meal.      metFORMIN (GLUCOPHAGE-XR) 500 MG 24 hr tablet TAKE 1 TABLET BY MOUTH EVERY DAY WITH DINNER      metoprolol succinate (TOPROL-XL) 25 MG 24 hr tablet Take 1 tablet (25 mg total) by mouth in the morning.      montelukast (SINGULAIR) 10 mg tablet Take 1 tablet (10 mg total) by mouth.      multivitamin-Ca-iron-minerals Tab Take 1 tablet by mouth daily.      omeprazole (PRILOSEC) 20 MG capsule Take 1 capsule (20 mg total) by mouth in the morning.      traZODone (DESYREL) 50 MG tablet       triamcinolone (KENALOG) 0.1 % cream        No current facility-administered medications for this visit.        Changes to medications: Saydi reports no changes at this time.    Medication list has been reviewed and updated in Epic: Yes    Allergies   Allergen Reactions    Sulfamethoxazole      Other reaction(s): RASH    Azithromycin Rash    Zolpidem Other (See Comments)     sleepwalking  Other reaction(s): Other (See Comments)  sleepwalking  Other reaction(s): Other (See Comments)  sleepwalking      Amoxicillin Rash    Atorvastatin Nausea Only       Changes to allergies: No    Allergies have been reviewed and updated in Epic: Yes    SPECIALTY MEDICATION ADHERENCE     Dupixent Pen 300mg /66mL  : 0 doses of medicine on hand     Medication Adherence    Patient reported X missed doses in the last month: 0  Specialty Medication: Dupixent Pen 300mg /67mL  Informant: patient  Confirmed plan for next specialty medication refill: delivery by pharmacy  Refills needed for supportive medications: not needed          Specialty medication(s) dose(s) confirmed: Regimen is correct and unchanged.     Are there any concerns with adherence? No    Adherence counseling provided? Not needed    CLINICAL MANAGEMENT AND INTERVENTION      Clinical Benefit Assessment:    Do you feel the medicine is effective or helping your condition? Yes    Clinical Benefit counseling provided? Not needed    Adverse Effects Assessment:    Are you experiencing any side effects? No    Are you experiencing difficulty administering your medicine? No    Quality of Life Assessment:    Quality of Life    Rheumatology  Oncology  Dermatology  1. What impact has your specialty medication had on the symptoms of your skin condition (i.e. itchiness, soreness, stinging)?: Some  2. What impact has your specialty medication had on your comfort level with your skin?: Some  Cystic Fibrosis          How many days over the past month did your condition  keep you from your normal activities? For example, brushing your teeth or getting up in the morning. 0    Have you discussed this with your provider? Not needed    Acute Infection Status:    Acute infections noted within Epic:  No active infections    Patient reported infection: None    Therapy Appropriateness:    Is therapy appropriate based on current medication list, adverse reactions, adherence, clinical benefit and progress toward achieving therapeutic goals? Yes, therapy is appropriate and should be continued     Clinical Intervention:    Was an intervention completed as part of this clinical assessment? No    DISEASE/MEDICATION-SPECIFIC INFORMATION      For patients on injectable medications: Patient currently has 0 doses left.  Next injection is scheduled for 05/23/2023.    Chronic Inflammatory Diseases: Have you experienced any flares in the last month? No  Has this been reported to your provider? Not applicable    PATIENT SPECIFIC NEEDS     Does the patient have any physical, cognitive, or cultural barriers? No    Is the patient high risk? No    Does the patient require physician intervention or other additional services (i.e., nutrition, smoking cessation, social work)? No    Does the patient have an additional or emergency contact listed in their chart? Yes    SOCIAL DETERMINANTS OF HEALTH     At the [facility], we have learned that life circumstances - like trouble affording food, housing, utilities, or transportation can affect the health of many of our patients.   That is why we wanted to ask: are you currently experiencing any life circumstances that are negatively impacting your health and/or quality of life? Yes    Social Drivers  of Health     Food Insecurity: No Food Insecurity (10/23/2020)    Hunger Vital Sign     Worried About Running Out of Food in the Last Year: Never true     Ran Out of Food in the Last Year: Never true   Internet Connectivity: Not on file   Housing/Utilities: Low Risk  (10/23/2020)    Housing/Utilities     Within the past 12 months, have you ever stayed: outside, in a car, in a tent, in an overnight shelter, or temporarily in someone else's home (i.e. couch-surfing)?: No     Are you worried about losing your housing?: No     Within the past 12 months, have you been unable to get utilities (heat, electricity) when it was really needed?: No   Tobacco Use: Medium Risk (07/24/2022)    Patient History     Smoking Tobacco Use: Former     Smokeless Tobacco Use: Former     Passive Exposure: Current   Transportation Needs: No Transportation Needs (10/23/2020)    PRAPARE - Therapist, art (Medical): No     Lack of Transportation (Non-Medical): No   Alcohol Use: Not At Risk (03/14/2017)    Received from [facility]    AUDIT-C     Frequency of Alcohol Consumption: 2-3 times a week     Average Number of Drinks: 1 or 2     Frequency of Binge Drinking: Never   Interpersonal Safety: Not on file   Physical Activity: Insufficiently Active (03/14/2017)    Received from [facility]    Exercise Vital Sign     Days of Exercise per Week: 3 days     Minutes of Exercise per Session: 30 min   Intimate Partner Violence: Not on file   Stress: No Stress Concern Present (03/14/2017)    Received from [facility] Occupational [facility] - Occupational Stress Questionnaire     Feeling of Stress : Only a little   Substance Use: Not on file (01/26/2023)   Social Connections: Socially Integrated (03/14/2017)    Received from [facility]    Social Connection and Isolation Panel [NHANES]     Frequency of Communication with Friends and Family: More than three times a week     Frequency of Social Gatherings with Friends and Family: Not asked     Attends Religious Services: More than 4 times per year     Active Member of [employer] or Organizations: Yes     Attends Engineer, structural: More than 4 times per year     Marital Status: Married   Physicist, medical Strain: Low Risk  (10/23/2020)    Overall Financial Resource Strain (CARDIA)     Difficulty of Paying Living Expenses: Not hard at all   Depression: Not at risk (01/31/2022)    Received from [facility]    PHQ-2     (OBSOLETE) Total Prescreening Score: 0   Health Literacy: Not on file       Would you be willing to receive help with any of the needs that you have identified today? Not applicable       SHIPPING     Specialty Medication(s) to be Shipped:   CF/Pulmonary/Asthma: Dupixent Pen 300mg /23mL    Other medication(s) to be shipped: No additional medications requested for fill at this time     Changes to  insurance: No    Delivery Scheduled: Yes, Expected medication delivery date: 05/22/2023.     Medication will be delivered via Same Day Courier to the confirmed prescription address in [facility].    The patient will receive a drug information handout for each medication shipped and additional FDA Medication Guides as required.  Verified that patient has previously received a Conservation officer, historic buildings and a Surveyor, mining.    The patient or caregiver noted above participated in the development of this care plan and knows that they can request review of or adjustments to the care plan at any time.      All of the patient's questions and concerns have been addressed.    Elnora Morrison, PharmD   [facility] Specialty Pharmacist

## 2023-05-17 ENCOUNTER — Ambulatory Visit: Payer: 59 | Admitting: Occupational Therapy

## 2023-05-17 DIAGNOSIS — M6281 Muscle weakness (generalized): Secondary | ICD-10-CM

## 2023-05-17 DIAGNOSIS — R600 Localized edema: Secondary | ICD-10-CM

## 2023-05-17 DIAGNOSIS — M25642 Stiffness of left hand, not elsewhere classified: Secondary | ICD-10-CM | POA: Diagnosis not present

## 2023-05-17 DIAGNOSIS — M79642 Pain in left hand: Secondary | ICD-10-CM

## 2023-05-17 NOTE — Therapy (Signed)
 OUTPATIENT OCCUPATIONAL THERAPY ORTHO TREATMENT  Patient Name: Debra Alexander MRN: [id number] DOB:[birth year], 73 y.o., female Today's Date: 05/17/2023  PCP: Rolan Lipa NP REFERRING PROVIDER: Floyce Stakes PA  END OF SESSION:  OT End of Session - 05/17/23 0753     Visit Number 2    Number of Visits 12    Date for OT Re-Evaluation 06/04/23    OT Start Time 0738    OT Stop Time 0824    OT Time Calculation (min) 46 min    Activity Tolerance Patient tolerated treatment well    Behavior During Therapy [facility] for tasks assessed/performed             Past Medical History:  Diagnosis Date   Hyperlipidemia    Hypertension    Osteoporosis    Past Surgical History:  Procedure Laterality Date   ABDOMINAL HYSTERECTOMY     BREAST BIOPSY Right 08/07/2022   Stereo Bx, X clip- path pending   BREAST BIOPSY Right 08/07/2022   MM RT BREAST BX W LOC DEV 1ST LESION IMAGE BX SPEC STEREO GUIDE 08/07/2022 [facility]   BREAST CYST ASPIRATION Left 2007   neg   There are no active problems to display for this patient.   ONSET DATE: 03/15/23  REFERRING DIAG: Fx of L 4th and 5th MC   THERAPY DIAG:  Stiffness of left hand, not elsewhere classified  Localized edema  Pain in left hand  Muscle weakness (generalized)  Rationale for Evaluation and Treatment: Rehabilitation  SUBJECTIVE:   SUBJECTIVE STATEMENT: Doing okay but the pinkie is really stiff  Pt accompanied by: self  PERTINENT HISTORY:  Ortho NOTE [place] 05/09/23: Left hand shows no swelling. No skin breakdown noted. No rotational deformity. She has some stiffness of the fourth and fifth digit but normal range of motion of the thumb index and middle finger. Patient lacks 3 cm of full composite fist of the ring finger, fifth digit she is able to get to 80 degrees of flexion at the MCP joint. Patient is nontender to palpation along the fracture sites.  AP lateral and oblique views of the left hand are ordered interpreted by me in the  office today. Impression: Patient has oblique fractures of the fourth and fifth metacarpal neck's extending into the midshaft. There does not appear to be any intra-articular extension of the fractures. Fifth metacarpal shaft fracture appears to have subtle shortening when compared to previous x-rays. There is subtle increase in callus summation along the fourth and fifth metacarpal shaft. Fracture is not healed at this time.  Impression: Closed displaced fracture of base of fifth metacarpal bone of right hand with routine healing, subsequent encounter [S62.316D] Closed displaced fracture of base of fifth metacarpal bone of right hand with routine healing, subsequent encounter (primary encounter diagnosis) Closed displaced fracture of base of fourth metacarpal bone of left hand with routine healing, subsequent encounter  Plan:  1. Continue with removable Velcro boxer splint. Work on gentle range of motion exercises. Referral placed to hand therapy. Although there is minimal callus on x-ray, patient is nontender on exam. Will continue to protect with splint but also start increasing range of motion exercises 2. Follow-up with Dr. Rosita Kea in 6 weeks for repeat x-rays of the right hand.   PRECAUTIONS: None    WEIGHT BEARING RESTRICTIONS: No  PAIN:  Are you having pain? 0/10 at rest - 10/10 when trying to move or bend ring and pinkie  FALLS: Has patient fallen in last 6 months? 1  LIVING  ENVIRONMENT: Lives with: Son lives with her  PLOF: Had normal active range of motion and strength in left hand.  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  NEXT MD VISIT: 06/14/2023  OBJECTIVE:  Note: Objective measures were completed at Evaluation unless otherwise noted.  HAND DOMINANCE: Right  ADLs: Wearing splint 75% of the time.  Not able to grip or pick up or pull or push  anything with left hand  FUNCTIONAL OUTCOME MEASURES: PRWHE for pain 30/50 and function 36/50  UPPER EXTREMITY ROM:     Active ROM Right eval Left eval L  05/17/23  Shoulder flexion     Shoulder abduction     Shoulder adduction     Shoulder extension     Shoulder internal rotation     Shoulder external rotation     Elbow flexion     Elbow extension     Wrist flexion  54 74  Wrist extension  54 58  Wrist ulnar deviation  WNL   Wrist radial deviation  WNL   Wrist pronation  WNL   Wrist supination  WNL   (Blank rows = not tested)  Active ROM Right eval Left eval L 05/17/23  Thumb MCP (0-60)     Thumb IP (0-80)     Thumb Radial abd/add (0-55)  WNL    Thumb Palmar abd/add (0-45)  WNL    Thumb Opposition to Small Finger   base of 5th    Index MCP (0-90)  70  80  Index PIP (0-100)   80 95  Index DIP (0-70)       Long MCP (0-90)    75 75  Long PIP (0-100)    90 95  Long DIP (0-70)       Ring MCP (0-90)    40 44  Ring PIP (0-100)    70 65  Ring DIP (0-70)       Little MCP (0-90)    40 35  Little PIP (0-100)    50 60  Little DIP (0-70)       (Blank rows = not tested)  HAND FUNCTION: Grip strength: Right:   lbs; Left:   lbs, Lateral pinch: Right:   lbs, Left:   lbs, and 3 point pinch: Right:   lbs, Left:   lbs  COORDINATION: Decrease still wearing ulnar gutter splint for protection 75% of the time  SENSATION: Reports no sensation issues  EDEMA: Over ulnar side of hand at fourth and fifth metacarpals  COGNITION: Overall cognitive status: Within functional limits for tasks assessed      TREATMENT DATE: 05/17/23  Patient arrived with prefab ulnar gutter splint immobilizing wrist as well as all joints on fourth and fifth digits.  With MCP at full extension  Measurements taken -patient show increased wrist flexion extension as well as some MC and  PIP increased motion see flowsheet Modalities: Fluidotherapy:  Time: 8 Location: Hand and wrist left Done while fabricating custom  hand base ulnar gutter splint  - Active range of motion for wrist flexion extension as well as digits metacarpal flexion and restrictive fist and composite fist prior to review of home exercises to decrease stiffness  Patient to do contrast 3 times a day at least followed by wrist flexion extension 12 reps active assisted range of motion Got date of evaluation verbal order from PA to make or fabricate custom hand-based ulnar gutter splint leaving wrist and DIP PIP free on fourth of fifth. Done this date.  Patient to wear it when doing heavy activities.  Can keep it off for bathing and dressing.  Or when out and about.  Active range of motion blocked MC flexion of fourth and fifth digits with extension 12 reps Gentle passive range of motion to DIP and PIP of all digits prior to intrinsic assist blocked 12 reps Composite flexion for second and third touching palm. Followed by active range of motion to 3 cm foam block out of palm 2nd through 4th.  12 reps Slight pull keeping pain under 3/10 Opposition to all digits. Active assisted range of motion for wrist flexion and extension 12 reps Patient to continue with same home program       PATIENT EDUCATION: Education details: findings of eval and HEP  Person educated: Patient Education method: Explanation, Demonstration, Tactile cues, Verbal cues, and Handouts Education comprehension: verbalized understanding, returned demonstration, verbal cues required, and needs further education    GOALS: Goals reviewed with patient? Yes   LONG TERM GOALS: Target date: 8 wks  Patient to be independent in home program to increase left wrist active range of motion flexion extension within normal limits Baseline: Patient arrived with a prefab ulnar gutter splint on wrist flexion extension 54 degrees with a slight  pain-wearing prefab 75% the time as well as sleeping Goal status: INITIAL  2.  Patient to be independent in home program to decrease edema and pain and increase motion in left hand digits to touch palm symptom-free Baseline: MC flexion in left hand 40 degrees to 75 and PIP 50 to 90 degrees.  With fifth digit worse than fourth.  No knowledge of home program. Goal status: INITIAL  3.  Fabricated patient a custom ulnar gutter splint to allow wrist and DIP PIP AROM. Baseline: Patient has a prefab ulnar gutter splint that immobilizes the wrist as well as all digits on the fourth and fifth.  Wearing at 75% of the time. Goal status: INITIAL  4.  After the patient's next orthopedic visit initiate strengthening to return to within range for her age for grip and prehension. Baseline: Will assess grip and prehension after next orthopedic visit to confirm healing of fracture site with x-ray-last x-ray did not show callus formation Goal status: INITIAL  ASSESSMENT:  CLINICAL IMPRESSION: Patient seen in OT  for left fourth and fifth metacarpal fractures.  Injury was 03/15/2023.  Patient was immobilized in a ulnar gutter splint-prefab that also included wrist and DIP PIP.  Patient arrived with splint on still wearing it 75% of the time and nighttime.  Last orthopedic appointment x-ray showed not a lot of callus formation.  Got a verbal order from PA [place] [employer] at evaluation date to fabricate hand-based ulnar gutter splint allowing active range of motion at wrist and DIP PIP at 4th and 5th - place at 35 degrees [facility] flexion-patient show some progress in digit flexion as well as wrist flexion extension compared to evaluation -patient cont to show edema, over ulnar hand, decrease ROM and  strength.  Patient limited in functional use of left hand in ADLs and IADLs.  Patient can benefit from skilled OT services to decrease edema pain increase motion and strength to return to prior level of function.  PERFORMANCE  DEFICITS: in functional skills including ADLs, IADLs, ROM, strength, pain, flexibility, decreased knowledge of use of DME, and UE functional use,   and psychosocial skills including environmental adaptation and routines and behaviors.   IMPAIRMENTS: are limiting patient from ADLs, IADLs, rest and sleep, play, leisure, and social participation.   COMORBIDITIES: has no other co-morbidities that affects occupational performance. Patient will benefit from skilled OT to address above impairments and improve overall function.  MODIFICATION OR ASSISTANCE TO COMPLETE EVALUATION: No modification of tasks or assist necessary to complete an evaluation.  OT OCCUPATIONAL PROFILE AND HISTORY: Problem focused assessment: Including review of records relating to presenting problem.  CLINICAL DECISION MAKING: LOW - limited treatment options, no task modification necessary  REHAB POTENTIAL: Good for goals  EVALUATION COMPLEXITY: Low   PLAN:  OT FREQUENCY: 1-2x/week  OT DURATION: 8 weeks  PLANNED INTERVENTIONS: 97168 OT Re-evaluation, 97535 self care/ADL training, 40981 therapeutic exercise, 97530 therapeutic activity, 97112 neuromuscular re-education, 97140 manual therapy, 97018 paraffin, 19147 fluidotherapy, 97034 contrast bath, 97760 Orthotics management and training, 82956 Splinting (initial encounter), passive range of motion, patient/family education, and DME and/or AE instructions    CONSULTED AND AGREED WITH PLAN OF CARE: Patient     Oletta Cohn, OTR/L,CLT 05/17/2023, 8:30 AM

## 2023-05-21 ENCOUNTER — Ambulatory Visit: Payer: 59 | Attending: Orthopedic Surgery | Admitting: Occupational Therapy

## 2023-05-21 DIAGNOSIS — M79642 Pain in left hand: Secondary | ICD-10-CM | POA: Diagnosis present

## 2023-05-21 DIAGNOSIS — M6281 Muscle weakness (generalized): Secondary | ICD-10-CM | POA: Diagnosis present

## 2023-05-21 DIAGNOSIS — M25642 Stiffness of left hand, not elsewhere classified: Secondary | ICD-10-CM | POA: Insufficient documentation

## 2023-05-21 DIAGNOSIS — R600 Localized edema: Secondary | ICD-10-CM | POA: Diagnosis present

## 2023-05-21 NOTE — Therapy (Signed)
 OUTPATIENT OCCUPATIONAL THERAPY ORTHO TREATMENT  Patient Name: Debra Alexander MRN: [id number] DOB:[birth year], 73 y.o., female Today's Date: 05/21/2023  PCP: Rolan Lipa NP REFERRING PROVIDER: Floyce Stakes PA  END OF SESSION:  OT End of Session - 05/21/23 1505     Visit Number 3    Number of Visits 12    Date for OT Re-Evaluation 06/04/23    OT Start Time 1454    Activity Tolerance Patient tolerated treatment well    Behavior During Therapy [facility] for tasks assessed/performed             Past Medical History:  Diagnosis Date   Hyperlipidemia    Hypertension    Osteoporosis    Past Surgical History:  Procedure Laterality Date   ABDOMINAL HYSTERECTOMY     BREAST BIOPSY Right 08/07/2022   Stereo Bx, X clip- path pending   BREAST BIOPSY Right 08/07/2022   MM RT BREAST BX W LOC DEV 1ST LESION IMAGE BX SPEC STEREO GUIDE 08/07/2022 [facility]   BREAST CYST ASPIRATION Left 2007   neg   There are no active problems to display for this patient.   ONSET DATE: 03/15/23  REFERRING DIAG: Fx of L 4th and 5th MC   THERAPY DIAG:  Stiffness of left hand, not elsewhere classified  Localized edema  Pain in left hand  Muscle weakness (generalized)  Rationale for Evaluation and Treatment: Rehabilitation  SUBJECTIVE:   SUBJECTIVE STATEMENT: Doing okay but the pinkie stay stiff -I been wearing the splint to give make me.  I just need some padding over the top of my hand. Pt accompanied by: self  PERTINENT HISTORY:  Ortho NOTE [place] 05/09/23: Left hand shows no swelling. No skin breakdown noted. No rotational deformity. She has some stiffness of the fourth and fifth digit but normal range of motion of the thumb index and middle finger. Patient lacks 3 cm of full composite fist of the ring finger, fifth digit she is able to get to 80 degrees of flexion at the MCP joint. Patient is nontender to palpation along the fracture sites.  AP lateral and oblique views of the left hand are  ordered interpreted by me in the office today. Impression: Patient has oblique fractures of the fourth and fifth metacarpal neck's extending into the midshaft. There does not appear to be any intra-articular extension of the fractures. Fifth metacarpal shaft fracture appears to have subtle shortening when compared to previous x-rays. There is subtle increase in callus summation along the fourth and fifth metacarpal shaft. Fracture is not healed at this time.  Impression: Closed displaced fracture of base of fifth metacarpal bone of right hand with routine healing, subsequent encounter [S62.316D] Closed displaced fracture of base of fifth metacarpal bone of right hand with routine healing, subsequent encounter (primary encounter diagnosis) Closed displaced fracture of base of fourth metacarpal bone of left hand with routine healing, subsequent encounter  Plan:  1. Continue with removable Velcro boxer splint. Work on gentle range of motion exercises. Referral placed to hand therapy. Although there is minimal callus on x-ray, patient is nontender on exam. Will continue to protect with splint but also start increasing range of motion exercises 2. Follow-up with Dr. Rosita Kea in 6 weeks for repeat x-rays of the right hand.   PRECAUTIONS: None    WEIGHT BEARING RESTRICTIONS: No  PAIN:  Are you having pain? 0/10 at rest - 10/10 when trying to move or bend ring and pinkie  FALLS: Has patient fallen in last 6 months?  1  LIVING ENVIRONMENT: Lives with: Son lives with her  PLOF: Had normal active range of motion and strength in left hand.  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  NEXT MD VISIT: 06/14/2023  OBJECTIVE:  Note: Objective measures were completed at Evaluation unless otherwise noted.  HAND DOMINANCE: Right  ADLs: Wearing splint 75% of the time.  Not able to  grip or pick up or pull or push anything with left hand  FUNCTIONAL OUTCOME MEASURES: PRWHE for pain 30/50 and function 36/50  UPPER EXTREMITY ROM:     Active ROM Right eval Left eval L  05/17/23 L  05/21/23  Shoulder flexion      Shoulder abduction      Shoulder adduction      Shoulder extension      Shoulder internal rotation      Shoulder external rotation      Elbow flexion      Elbow extension      Wrist flexion  54 74 74  Wrist extension  54 58 50  Wrist ulnar deviation  WNL    Wrist radial deviation  WNL    Wrist pronation  WNL    Wrist supination  WNL    (Blank rows = not tested)  Active ROM Right eval Left eval L 05/17/23 L 05/21/23  Thumb MCP (0-60)      Thumb IP (0-80)      Thumb Radial abd/add (0-55)  WNL     Thumb Palmar abd/add (0-45)  WNL     Thumb Opposition to Small Finger   base of 5th     Index MCP (0-90)  70  80 75  Index PIP (0-100)   80 95 95  Index DIP (0-70)        Long MCP (0-90)    75 75 75  Long PIP (0-100)    90 95 95  Long DIP (0-70)        Ring MCP (0-90)    40 44 50  Ring PIP (0-100)    70 65 70  Ring DIP (0-70)        Little MCP (0-90)    40 35 50  Little PIP (0-100)    50 60 50  Little DIP (0-70)        (Blank rows = not tested)  HAND FUNCTION: Grip strength: Right:   lbs; Left:   lbs, Lateral pinch: Right:   lbs, Left:   lbs, and 3 point pinch: Right:   lbs, Left:   lbs  COORDINATION: Decrease still wearing ulnar gutter splint for protection 75% of the time  SENSATION: Reports no sensation issues  EDEMA: Over ulnar side of hand at fourth and fifth metacarpals  COGNITION: Overall cognitive status: Within functional limits for tasks assessed      TREATMENT DATE: 05/21/23  Measurements taken - wrist flexion extension about the same.  Reviewed with patient home exercises for active assisted range of  motion for wrist flexion extension.  Can do prolonged flexion stretch 3 x 30 seconds several times during the day.  Showed great progress in session.   Measurements for digits some improvement in fourth and fifth digit.  See flowsheet   Modalities: Fluidotherapy:  Time: 8 Location: Hand and wrist left  Active range of motion for wrist flexion extension as well as digits metacarpal flexion and restrictive fist and composite fist prior to review of home exercises to decrease stiffness  Patient to do contrast 3 times a day at least followed by wrist flexion extension 12 reps active assisted range of motion Patient to continue with hand-based ulnar gutter splint with the wrist not included as well as fourth PIP/DIP.  Extra moleskin applied to decrease discomfort and pressure over dorsal hand.  Soft tissue lopes using Graston #2 for brushing and sweeping over forearm prior to wrist flexion extension as well as volar hand and volar digits prior to  Gentle passive range of motion to DIP and PIP of all digits prior to intrinsic assist blocked 12 reps Composite flexion for second and third touching palm.  At placing hold Composite fist also to 4 cm cylinder object.  Able to touch with fourth and fifth digit. Provided to home program for patient to be able to do by the end of the session. Slight pull keeping pain under 3/10 Opposition to all digits. Active assisted range of motion for wrist flexion and extension 12 reps Patient to continue with same home program       PATIENT EDUCATION: Education details: findings of eval and HEP  Person educated: Patient Education method: Explanation, Demonstration, Tactile cues, Verbal cues, and Handouts Education comprehension: verbalized understanding, returned demonstration, verbal cues required, and needs further education    GOALS: Goals reviewed with patient? Yes   LONG TERM GOALS: Target date: 8 wks  Patient to be independent in home program to  increase left wrist active range of motion flexion extension within normal limits Baseline: Patient arrived with a prefab ulnar gutter splint on wrist flexion extension 54 degrees with a slight pain-wearing prefab 75% the time as well as sleeping Goal status: INITIAL  2.  Patient to be independent in home program to decrease edema and pain and increase motion in left hand digits to touch palm symptom-free Baseline: MC flexion in left hand 40 degrees to 75 and PIP 50 to 90 degrees.  With fifth digit worse than fourth.  No knowledge of home program. Goal status: INITIAL  3.  Fabricated patient a custom ulnar gutter splint to allow wrist and DIP PIP AROM. Baseline: Patient has a prefab ulnar gutter splint that immobilizes the wrist as well as all digits on the fourth and fifth.  Wearing at 75% of the time. Goal status: INITIAL  4.  After the patient's next orthopedic visit initiate strengthening to return to within range for her age for grip and prehension. Baseline: Will assess grip and prehension after next orthopedic visit to confirm healing of fracture site with x-ray-last x-ray did not show callus formation Goal status: INITIAL  ASSESSMENT:  CLINICAL IMPRESSION: Patient seen in OT  for left fourth and fifth metacarpal fractures.  Injury was 03/15/2023.  Patient was immobilized in a ulnar gutter splint-prefab that also included wrist and DIP PIP.  Patient arrived custom ulnar gutter splint allowing active range of motion at wrist  and DIP PIP at 4th and 5th today.  Patient show some progress in fourth and fifth digit flexion as well as wrist flexion extension compared to evaluation -wrist active range of motion are about the same than last time.  Reviewed with patient active assisted range and passive range of motion for wrist flexion extension.  Continue to show decrease ROM and  strength in left hand and wrist..  Patient limited in functional use of left hand in ADLs and IADLs.  Patient can  benefit from skilled OT services to decrease edema pain increase motion and strength to return to prior level of function.  PERFORMANCE DEFICITS: in functional skills including ADLs, IADLs, ROM, strength, pain, flexibility, decreased knowledge of use of DME, and UE functional use,   and psychosocial skills including environmental adaptation and routines and behaviors.   IMPAIRMENTS: are limiting patient from ADLs, IADLs, rest and sleep, play, leisure, and social participation.   COMORBIDITIES: has no other co-morbidities that affects occupational performance. Patient will benefit from skilled OT to address above impairments and improve overall function.  MODIFICATION OR ASSISTANCE TO COMPLETE EVALUATION: No modification of tasks or assist necessary to complete an evaluation.  OT OCCUPATIONAL PROFILE AND HISTORY: Problem focused assessment: Including review of records relating to presenting problem.  CLINICAL DECISION MAKING: LOW - limited treatment options, no task modification necessary  REHAB POTENTIAL: Good for goals  EVALUATION COMPLEXITY: Low   PLAN:  OT FREQUENCY: 1-2x/week  OT DURATION: 8 weeks  PLANNED INTERVENTIONS: 97168 OT Re-evaluation, 97535 self care/ADL training, 40981 therapeutic exercise, 97530 therapeutic activity, 97112 neuromuscular re-education, 97140 manual therapy, 97018 paraffin, 19147 fluidotherapy, 97034 contrast bath, 97760 Orthotics management and training, 82956 Splinting (initial encounter), passive range of motion, patient/family education, and DME and/or AE instructions    CONSULTED AND AGREED WITH PLAN OF CARE: Patient     Oletta Cohn, OTR/L,CLT 05/21/2023, 3:05 PM

## 2023-05-22 MED FILL — DUPIXENT 300 MG/2 ML SUBCUTANEOUS PEN INJECTOR: SUBCUTANEOUS | 28 days supply | Qty: 4 | Fill #3

## 2023-05-24 ENCOUNTER — Ambulatory Visit: Payer: 59 | Admitting: Occupational Therapy

## 2023-05-24 DIAGNOSIS — R600 Localized edema: Secondary | ICD-10-CM

## 2023-05-24 DIAGNOSIS — M6281 Muscle weakness (generalized): Secondary | ICD-10-CM

## 2023-05-24 DIAGNOSIS — M25642 Stiffness of left hand, not elsewhere classified: Secondary | ICD-10-CM | POA: Diagnosis not present

## 2023-05-24 DIAGNOSIS — M79642 Pain in left hand: Secondary | ICD-10-CM

## 2023-05-24 NOTE — Therapy (Signed)
 OUTPATIENT OCCUPATIONAL THERAPY ORTHO TREATMENT  Patient Name: Debra Alexander MRN: [id number] DOB:Oct 29, 1950, 73 y.o., female Today's Date: 05/24/2023  PCP: Rolan Lipa NP REFERRING PROVIDER: Floyce Stakes PA  END OF SESSION:  OT End of Session - 05/24/23 1757     Visit Number 4    Number of Visits 12    Date for OT Re-Evaluation 06/04/23    OT Start Time 1455    OT Stop Time 1535    OT Time Calculation (min) 40 min    Activity Tolerance Patient tolerated treatment well    Behavior During Therapy [facility] for tasks assessed/performed             Past Medical History:  Diagnosis Date   Hyperlipidemia    Hypertension    Osteoporosis    Past Surgical History:  Procedure Laterality Date   ABDOMINAL HYSTERECTOMY     BREAST BIOPSY Right 08/07/2022   Stereo Bx, X clip- path pending   BREAST BIOPSY Right 08/07/2022   MM RT BREAST BX W LOC DEV 1ST LESION IMAGE BX SPEC STEREO GUIDE 08/07/2022 [facility]   BREAST CYST ASPIRATION Left 2007   neg   There are no active problems to display for this patient.   ONSET DATE: 03/15/23  REFERRING DIAG: Fx of L 4th and 5th MC   THERAPY DIAG:  Stiffness of left hand, not elsewhere classified  Localized edema  Pain in left hand  Muscle weakness (generalized)  Rationale for Evaluation and Treatment: Rehabilitation  SUBJECTIVE:   SUBJECTIVE STATEMENT: Doing okay.  The ring finger and pinky states stiff -I wear the splint probably 50% of the time at nighttime.   Pt accompanied by: self  PERTINENT HISTORY:  Ortho NOTE [facility] 05/09/23: Left hand shows no swelling. No skin breakdown noted. No rotational deformity. She has some stiffness of the fourth and fifth digit but normal range of motion of the thumb index and middle finger. Patient lacks 3 cm of full composite fist of the ring finger, fifth digit she is able to get to 80 degrees of flexion at the MCP joint. Patient is nontender to palpation along the fracture sites.  AP lateral and  oblique views of the left hand are ordered interpreted by me in the office today. Impression: Patient has oblique fractures of the fourth and fifth metacarpal neck's extending into the midshaft. There does not appear to be any intra-articular extension of the fractures. Fifth metacarpal shaft fracture appears to have subtle shortening when compared to previous x-rays. There is subtle increase in callus summation along the fourth and fifth metacarpal shaft. Fracture is not healed at this time.  Impression: Closed displaced fracture of base of fifth metacarpal bone of right hand with routine healing, subsequent encounter [S62.316D] Closed displaced fracture of base of fifth metacarpal bone of right hand with routine healing, subsequent encounter (primary encounter diagnosis) Closed displaced fracture of base of fourth metacarpal bone of left hand with routine healing, subsequent encounter  Plan:  1. Continue with removable Velcro boxer splint. Work on gentle range of motion exercises. Referral placed to hand therapy. Although there is minimal callus on x-ray, patient is nontender on exam. Will continue to protect with splint but also start increasing range of motion exercises 2. Follow-up with Dr. Rosita Kea in 6 weeks for repeat x-rays of the right hand.   PRECAUTIONS: None    WEIGHT BEARING RESTRICTIONS: No  PAIN:  Are you having pain? 0/10 at rest - 17/10 when trying to move or bend ring  and pinkie  FALLS: Has patient fallen in last 6 months? 1  LIVING ENVIRONMENT: Lives with: Son lives with her  PLOF: Had normal active range of motion and strength in left hand.  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  NEXT MD VISIT: 06/14/2023  OBJECTIVE:  Note: Objective measures were completed at Evaluation unless otherwise noted.  HAND DOMINANCE: Right  ADLs: Wearing  splint 75% of the time.  Not able to grip or pick up or pull or push anything with left hand  FUNCTIONAL OUTCOME MEASURES: PRWHE for pain 30/50 and function 36/50  UPPER EXTREMITY ROM:     Active ROM Right eval Left eval L  05/17/23 L  05/21/23 L 05/24/23  Shoulder flexion       Shoulder abduction       Shoulder adduction       Shoulder extension       Shoulder internal rotation       Shoulder external rotation       Elbow flexion       Elbow extension       Wrist flexion  54 74 74 65  Wrist extension  54 58 50 80  Wrist ulnar deviation  WNL     Wrist radial deviation  WNL     Wrist pronation  WNL     Wrist supination  WNL     (Blank rows = not tested)  Active ROM Right eval Left eval L 05/17/23 L 05/21/23 L 05/24/23  Thumb MCP (0-60)       Thumb IP (0-80)       Thumb Radial abd/add (0-55)  WNL      Thumb Palmar abd/add (0-45)  WNL      Thumb Opposition to Small Finger   base of 5th      Index MCP (0-90)  70  80 75 80  Index PIP (0-100)   80 95 95 90  Index DIP (0-70)         Long MCP (0-90)    75 75 75 80  Long PIP (0-100)    90 95 95 95  Long DIP (0-70)         Ring MCP (0-90)    40 44 50 60  Ring PIP (0-100)    70 65 70 75  Ring DIP (0-70)         Little MCP (0-90)    40 35 50 50  Little PIP (0-100)    50 60 50 60  Little DIP (0-70)         (Blank rows = not tested)  HAND FUNCTION: Grip strength: Right:   lbs; Left:   lbs, Lateral pinch: Right:   lbs, Left:   lbs, and 3 point pinch: Right:   lbs, Left:   lbs  COORDINATION: Decrease still wearing ulnar gutter splint for protection 75% of the time  SENSATION: Reports no sensation issues  EDEMA: Over ulnar side of hand at fourth and fifth metacarpals  COGNITION: Overall cognitive status: Within functional limits for tasks assessed      TREATMENT DATE: 05/24/23  Measurements taken -  wrist flexion improved -increase flexion of all digits. See flowsheet  reviewed with patient home exercises for active assisted range of motion for wrist flexion extension.  Can do prolonged flexion stretch 3 x 30 seconds several times during the day.     Modalities: Fluidotherapy:  Time: 10 Location: Hand and wrist left  Active range of motion for wrist flexion extension as well as digits metacarpal flexion and restrictive fist and composite fist prior to review of home exercises to decrease stiffness-2 cycles of ice to decrease edema and pain.  Patient to do contrast 3 times a day at least followed by wrist flexion extension 12 reps active assisted range of motion Patient to continue with hand-based ulnar gutter splint with the wrist not included as well as fourth PIP/DIP.  Extra moleskin applied to decrease discomfort and pressure over dorsal hand. Reviewed with patient to keep splint off during the day when sitting. Encourage patient to do at least every hour while doing her job as Comptroller some MCP active assisted flexion followed by passive range of motion to DIP PIP flexion, followed by blocked intrinsic a fist. Composite fist to large foam roller 12 reps Followed by rolling or crunching a towel or washcloth to activate all finger flexors.  Soft tissue lopes using Graston #2 for brushing and sweeping over forearm prior to wrist flexion extension as well as volar hand and volar digits prior to  Gentle active assisted range of motion MC flexion to 90 degrees 10 reps Gentle passive range of motion to DIP and PIP of all digits  -with focus on fourth and fifth  prior to intrinsic assist blocked 12 reps Composite flexion f and patient hold to large foam roller 10 reps Provided for patient to use at home At buddy strap for patient to use force to third digit to facilitate increased active motion to the fourth during the day Slight pull keeping pain under 3/10 Opposition to all digits. \Done  some towel rolling and crunches with washcloth to facilitate digit flexion.  Add to home program     PATIENT EDUCATION: Education details: findings of eval and HEP  Person educated: Patient Education method: Explanation, Demonstration, Tactile cues, Verbal cues, and Handouts Education comprehension: verbalized understanding, returned demonstration, verbal cues required, and needs further education    GOALS: Goals reviewed with patient? Yes   LONG TERM GOALS: Target date: 8 wks  Patient to be independent in home program to increase left wrist active range of motion flexion extension within normal limits Baseline: Patient arrived with a prefab ulnar gutter splint on wrist flexion extension 54 degrees with a slight pain-wearing prefab 75% the time as well as sleeping Goal status: INITIAL  2.  Patient to be independent in home program to decrease edema and pain and increase motion in left hand digits to touch palm symptom-free Baseline: MC flexion in left hand 40 degrees to 75 and PIP 50 to 90 degrees.  With fifth digit worse than fourth.  No knowledge of home program. Goal status: INITIAL  3.  Fabricated patient a custom ulnar gutter splint to allow wrist and DIP PIP AROM. Baseline: Patient has a prefab ulnar gutter splint that immobilizes the wrist as well as all digits on the fourth and fifth.  Wearing at 75% of the time. Goal status: INITIAL  4.  After the patient's next orthopedic visit initiate strengthening to return to within range for her age for grip and prehension. Baseline: Will assess grip  and prehension after next orthopedic visit to confirm healing of fracture site with x-ray-last x-ray did not show callus formation Goal status: INITIAL  ASSESSMENT:  CLINICAL IMPRESSION: Patient seen in OT  for left fourth and fifth metacarpal fractures.  Injury was 03/15/2023.  Patient was immobilized in a ulnar gutter splint-prefab that also included wrist and DIP PIP.  Patient  wearing custom ulnar gutter splint allowing active range of motion at wrist and DIP PIP at 4th and 5th   Patient reports wearing it more than 50% during the day as well as nighttime.  Recommend for patient when sitting doing her job as Comptroller to remove it and trying do home exercises about every hour keeping pain under 3/10.  Patient continues to show progress in flexion of all digits as well as wrist flexion in the session.  Patient showed progress in session and fourth and fifth digit flexion.  Continue to show decrease ROM and  strength in left hand and wrist..  Patient limited in functional use of left hand in ADLs and IADLs.  Patient can benefit from skilled OT services to decrease edema pain increase motion and strength to return to prior level of function.  PERFORMANCE DEFICITS: in functional skills including ADLs, IADLs, ROM, strength, pain, flexibility, decreased knowledge of use of DME, and UE functional use,   and psychosocial skills including environmental adaptation and routines and behaviors.   IMPAIRMENTS: are limiting patient from ADLs, IADLs, rest and sleep, play, leisure, and social participation.   COMORBIDITIES: has no other co-morbidities that affects occupational performance. Patient will benefit from skilled OT to address above impairments and improve overall function.  MODIFICATION OR ASSISTANCE TO COMPLETE EVALUATION: No modification of tasks or assist necessary to complete an evaluation.  OT OCCUPATIONAL PROFILE AND HISTORY: Problem focused assessment: Including review of records relating to presenting problem.  CLINICAL DECISION MAKING: LOW - limited treatment options, no task modification necessary  REHAB POTENTIAL: Good for goals  EVALUATION COMPLEXITY: Low   PLAN:  OT FREQUENCY: 1-2x/week  OT DURATION: 8 weeks  PLANNED INTERVENTIONS: 97168 OT Re-evaluation, 97535 self care/ADL training, 16109 therapeutic exercise, 97530 therapeutic activity, 97112 neuromuscular  re-education, 97140 manual therapy, 97018 paraffin, 60454 fluidotherapy, 97034 contrast bath, 97760 Orthotics management and training, 09811 Splinting (initial encounter), passive range of motion, patient/family education, and DME and/or AE instructions    CONSULTED AND AGREED WITH PLAN OF CARE: Patient     Oletta Cohn, OTR/L,CLT 05/24/2023, 5:59 PM

## 2023-05-28 ENCOUNTER — Ambulatory Visit: Admitting: Occupational Therapy

## 2023-05-28 DIAGNOSIS — M79642 Pain in left hand: Secondary | ICD-10-CM

## 2023-05-28 DIAGNOSIS — R600 Localized edema: Secondary | ICD-10-CM

## 2023-05-28 DIAGNOSIS — M25642 Stiffness of left hand, not elsewhere classified: Secondary | ICD-10-CM | POA: Diagnosis not present

## 2023-05-28 DIAGNOSIS — M6281 Muscle weakness (generalized): Secondary | ICD-10-CM

## 2023-05-31 ENCOUNTER — Ambulatory Visit: Admitting: Occupational Therapy

## 2023-05-31 DIAGNOSIS — M79642 Pain in left hand: Secondary | ICD-10-CM

## 2023-05-31 DIAGNOSIS — M25642 Stiffness of left hand, not elsewhere classified: Secondary | ICD-10-CM

## 2023-05-31 DIAGNOSIS — R600 Localized edema: Secondary | ICD-10-CM

## 2023-06-02 ENCOUNTER — Encounter: Payer: Self-pay | Admitting: Occupational Therapy

## 2023-06-02 NOTE — Therapy (Signed)
 OUTPATIENT OCCUPATIONAL THERAPY ORTHO TREATMENT  Patient Name: Debra Alexander MRN: [id number] DOB:20-May-1950, 73 y.o., female Today's Date: 06/02/2023  PCP: Rolan Lipa NP REFERRING PROVIDER: Floyce Stakes PA  END OF SESSION:  OT End of Session - 06/02/23 1157     Visit Number 5    Number of Visits 12    Date for OT Re-Evaluation 06/04/23    OT Start Time 1650    OT Stop Time 1740    OT Time Calculation (min) 50 min    Activity Tolerance Patient tolerated treatment well    Behavior During Therapy [facility] for tasks assessed/performed             Past Medical History:  Diagnosis Date   Hyperlipidemia    Hypertension    Osteoporosis    Past Surgical History:  Procedure Laterality Date   ABDOMINAL HYSTERECTOMY     BREAST BIOPSY Right 08/07/2022   Stereo Bx, X clip- path pending   BREAST BIOPSY Right 08/07/2022   MM RT BREAST BX W LOC DEV 1ST LESION IMAGE BX SPEC STEREO GUIDE 08/07/2022 [facility]   BREAST CYST ASPIRATION Left 2007   neg   There are no active problems to display for this patient.   ONSET DATE: 03/15/23  REFERRING DIAG: Fx of L 4th and 5th MC   THERAPY DIAG:  Stiffness of left hand, not elsewhere classified  Localized edema  Muscle weakness (generalized)  Pain in left hand  Rationale for Evaluation and Treatment: Rehabilitation  SUBJECTIVE:   SUBJECTIVE STATEMENT: See below in today's tx note for subjective  Pt accompanied by: self  PERTINENT HISTORY:  Ortho NOTE [place] 05/09/23: Left hand shows no swelling. No skin breakdown noted. No rotational deformity. She has some stiffness of the fourth and fifth digit but normal range of motion of the thumb index and middle finger. Patient lacks 3 cm of full composite fist of the ring finger, fifth digit she is able to get to 80 degrees of flexion at the MCP joint. Patient is nontender to palpation along the fracture sites.  AP lateral and oblique views of the left hand are ordered interpreted by me in the  office today. Impression: Patient has oblique fractures of the fourth and fifth metacarpal neck's extending into the midshaft. There does not appear to be any intra-articular extension of the fractures. Fifth metacarpal shaft fracture appears to have subtle shortening when compared to previous x-rays. There is subtle increase in callus summation along the fourth and fifth metacarpal shaft. Fracture is not healed at this time.  Impression: Closed displaced fracture of base of fifth metacarpal bone of right hand with routine healing, subsequent encounter [S62.316D] Closed displaced fracture of base of fifth metacarpal bone of right hand with routine healing, subsequent encounter (primary encounter diagnosis) Closed displaced fracture of base of fourth metacarpal bone of left hand with routine healing, subsequent encounter  Plan:  1. Continue with removable Velcro boxer splint. Work on gentle range of motion exercises. Referral placed to hand therapy. Although there is minimal callus on x-ray, patient is nontender on exam. Will continue to protect with splint but also start increasing range of motion exercises 2. Follow-up with Dr. Rosita Kea in 6 weeks for repeat x-rays of the right hand.   PRECAUTIONS: None    WEIGHT BEARING RESTRICTIONS: No  PAIN:  Are you having pain? 0/10 at rest - 17/10 when trying to move or bend ring and pinkie  FALLS: Has patient fallen in last 6 months? 1  LIVING  ENVIRONMENT: Lives with: Son lives with her  PLOF: Had normal active range of motion and strength in left hand.  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  NEXT MD VISIT: 06/14/2023  OBJECTIVE:  Note: Objective measures were completed at Evaluation unless otherwise noted.  HAND DOMINANCE: Right  ADLs: Wearing splint 75% of the time.  Not able to grip or pick up or pull or push  anything with left hand  FUNCTIONAL OUTCOME MEASURES: PRWHE for pain 30/50 and function 36/50  UPPER EXTREMITY ROM:     Active ROM Right eval Left eval L  05/17/23 L  05/21/23 L 05/24/23  Shoulder flexion       Shoulder abduction       Shoulder adduction       Shoulder extension       Shoulder internal rotation       Shoulder external rotation       Elbow flexion       Elbow extension       Wrist flexion  54 74 74 65  Wrist extension  54 58 50 80  Wrist ulnar deviation  WNL     Wrist radial deviation  WNL     Wrist pronation  WNL     Wrist supination  WNL     (Blank rows = not tested)  Active ROM Right eval Left eval L 05/17/23 L 05/21/23 L 05/24/23  Thumb MCP (0-60)       Thumb IP (0-80)       Thumb Radial abd/add (0-55)  WNL      Thumb Palmar abd/add (0-45)  WNL      Thumb Opposition to Small Finger   base of 5th      Index MCP (0-90)  70  80 75 80  Index PIP (0-100)   80 95 95 90  Index DIP (0-70)         Long MCP (0-90)    75 75 75 80  Long PIP (0-100)    90 95 95 95  Long DIP (0-70)         Ring MCP (0-90)    40 44 50 60  Ring PIP (0-100)    70 65 70 75  Ring DIP (0-70)         Little MCP (0-90)    40 35 50 50  Little PIP (0-100)    50 60 50 60  Little DIP (0-70)         (Blank rows = not tested)  HAND FUNCTION: Grip strength: Right:   lbs; Left:   lbs, Lateral pinch: Right:   lbs, Left:   lbs, and 3 point pinch: Right:   lbs, Left:   lbs  COORDINATION: Decrease still wearing ulnar gutter splint for protection 75% of the time  SENSATION: Reports no sensation issues  EDEMA: Over ulnar side of hand at fourth and fifth metacarpals  COGNITION: Overall cognitive status: Within functional limits for tasks assessed   TREATMENT DATE: 05/28/23  Pt reports she is doing well, continues to perform contrast at home and working on ROM exercises.     Modalities: Fluidotherapy:  Time: 10 Location: Hand and wrist left  Active range of motion for wrist flexion extension as well as digits metacarpal flexion and restrictive fist and composite fist prior to review of home exercises to decrease stiffness-2 cycles of ice to decrease edema and pain.  Manual therapy:  Following fluidotherapy, therapist performed Soft tissue mobilization to hand and wrist along with using Graston #2 for brushing and sweeping over forearm prior to wrist flexion extension as well as volar hand and volar digits   Therapeutic Exercises:   Recommend contrast 3 times a day at least followed by wrist flexion extension 12 reps active assisted range of motion Patient to continue with hand-based ulnar gutter splint with the wrist not included as well as fourth PIP/DIP.   Patient to keep splint off during the day when sitting. Continue to encourage patient to do at least every hour while doing her job as Comptroller some MCP active assisted flexion followed by passive range of motion to DIP PIP flexion, followed by blocked intrinsic a fist. Composite fist to large foam roller 12 reps, attempts at smaller roller this date. Crunching washcloth to activate all finger flexors.   Gentle active assisted range of motion MC flexion to 90 degrees 10 reps Gentle passive range of motion to DIP and PIP of all digits, continued  focus on fourth and fifth digits prior to intrinsic assist blocked 12 reps Composite flexion and patient hold to large foam roller 10 reps, working to progress to smaller foam roller Continue with buddy strap for patient to use force to third digit to facilitate increased active motion to the fourth during the day Slight pull keeping pain under 3/10 Opposition to all digits. Towel rolling and crunches with washcloth to facilitate digit flexion, perform with home program     PATIENT EDUCATION: Education details: findings of eval and HEP  Person educated:  Patient Education method: Explanation, Demonstration, Tactile cues, Verbal cues, and Handouts Education comprehension: verbalized understanding, returned demonstration, verbal cues required, and needs further education    GOALS: Goals reviewed with patient? Yes   LONG TERM GOALS: Target date: 8 wks  Patient to be independent in home program to increase left wrist active range of motion flexion extension within normal limits Baseline: Patient arrived with a prefab ulnar gutter splint on wrist flexion extension 54 degrees with a slight pain-wearing prefab 75% the time as well as sleeping Goal status: INITIAL  2.  Patient to be independent in home program to decrease edema and pain and increase motion in left hand digits to touch palm symptom-free Baseline: MC flexion in left hand 40 degrees to 75 and PIP 50 to 90 degrees.  With fifth digit worse than fourth.  No knowledge of home program. Goal status: INITIAL  3.  Fabricated patient a custom ulnar gutter splint to allow wrist and DIP PIP AROM. Baseline: Patient has a prefab ulnar gutter splint that immobilizes the wrist as well as all digits on the fourth and fifth.  Wearing at 75% of the time. Goal status: INITIAL  4.  After the patient's next orthopedic visit initiate strengthening to return to within range for her age for grip and prehension. Baseline: Will assess grip and prehension after next orthopedic visit to confirm healing of fracture site with x-ray-last x-ray did not show callus formation Goal status: INITIAL  ASSESSMENT:  CLINICAL  IMPRESSION: Patient seen in OT  for left fourth and fifth metacarpal fractures.  Injury was 03/15/2023.  Patient was immobilized in a ulnar gutter splint-prefab that also included wrist and DIP PIP.  Patient wearing custom ulnar gutter splint allowing active range of motion at wrist and DIP PIP at 4th and 5th   Patient reports wearing it more than 50% during the day as well as nighttime.  Recommend  for patient when sitting doing her job as Comptroller to remove it and trying do home exercises about every hour keeping pain under 3/10.  Patient continues to show progress in flexion of all digits as well as wrist flexion in the session.  Patient showed progress in session and fourth and fifth digit flexion. Pt responds well to use of foam roller for targeted flexion of digits.  Decreased pain overall and increased motion.  Responds well to use of fluidotherapy in combination with alternating with ice for a contrast effect to impact edema.  Continue to show decrease ROM and  strength in left hand and wrist..  Patient limited in functional use of left hand in ADLs and IADLs.  Patient can benefit from skilled OT services to decrease edema pain increase motion and strength to return to prior level of function.  PERFORMANCE DEFICITS: in functional skills including ADLs, IADLs, ROM, strength, pain, flexibility, decreased knowledge of use of DME, and UE functional use,   and psychosocial skills including environmental adaptation and routines and behaviors.   IMPAIRMENTS: are limiting patient from ADLs, IADLs, rest and sleep, play, leisure, and social participation.   COMORBIDITIES: has no other co-morbidities that affects occupational performance. Patient will benefit from skilled OT to address above impairments and improve overall function.  MODIFICATION OR ASSISTANCE TO COMPLETE EVALUATION: No modification of tasks or assist necessary to complete an evaluation.  OT OCCUPATIONAL PROFILE AND HISTORY: Problem focused assessment: Including review of records relating to presenting problem.  CLINICAL DECISION MAKING: LOW - limited treatment options, no task modification necessary  REHAB POTENTIAL: Good for goals  EVALUATION COMPLEXITY: Low   PLAN:  OT FREQUENCY: 1-2x/week  OT DURATION: 8 weeks  PLANNED INTERVENTIONS: 97168 OT Re-evaluation, 97535 self care/ADL training, 40981 therapeutic exercise, 97530  therapeutic activity, 97112 neuromuscular re-education, 97140 manual therapy, 97018 paraffin, 19147 fluidotherapy, 97034 contrast bath, 97760 Orthotics management and training, 82956 Splinting (initial encounter), passive range of motion, patient/family education, and DME and/or AE instructions  CONSULTED AND AGREED WITH PLAN OF CARE: Patient   Jazyah Butsch, OTR/L,CLT 06/02/2023, 12:11 PM

## 2023-06-04 ENCOUNTER — Encounter: Payer: Self-pay | Admitting: Occupational Therapy

## 2023-06-04 NOTE — Therapy (Signed)
 OUTPATIENT OCCUPATIONAL THERAPY ORTHO TREATMENT  Patient Name: Debra Alexander MRN: [id number] DOB:1951-03-06, 73 y.o., female Today's Date: 06/04/2023  PCP: Rolan Lipa NP REFERRING PROVIDER: Floyce Stakes PA  END OF SESSION:  OT End of Session - 06/04/23 1825     Visit Number 6    Number of Visits 12    Date for OT Re-Evaluation 06/04/23    OT Start Time 1530    OT Stop Time 1615    OT Time Calculation (min) 45 min    Activity Tolerance Patient tolerated treatment well    Behavior During Therapy [facility] for tasks assessed/performed             Past Medical History:  Diagnosis Date   Hyperlipidemia    Hypertension    Osteoporosis    Past Surgical History:  Procedure Laterality Date   ABDOMINAL HYSTERECTOMY     BREAST BIOPSY Right 08/07/2022   Stereo Bx, X clip- path pending   BREAST BIOPSY Right 08/07/2022   MM RT BREAST BX W LOC DEV 1ST LESION IMAGE BX SPEC STEREO GUIDE 08/07/2022 [facility]   BREAST CYST ASPIRATION Left 2007   neg   There are no active problems to display for this patient.   ONSET DATE: 03/15/23  REFERRING DIAG: Fx of L 4th and 5th MC   THERAPY DIAG:  Stiffness of left hand, not elsewhere classified  Pain in left hand  Localized edema  Rationale for Evaluation and Treatment: Rehabilitation  SUBJECTIVE:   SUBJECTIVE STATEMENT: See below in today's tx note for subjective  Pt accompanied by: self  PERTINENT HISTORY:  Ortho NOTE [place] 05/09/23: Left hand shows no swelling. No skin breakdown noted. No rotational deformity. She has some stiffness of the fourth and fifth digit but normal range of motion of the thumb index and middle finger. Patient lacks 3 cm of full composite fist of the ring finger, fifth digit she is able to get to 80 degrees of flexion at the MCP joint. Patient is nontender to palpation along the fracture sites.  AP lateral and oblique views of the left hand are ordered interpreted by me in the office today. Impression: Patient  has oblique fractures of the fourth and fifth metacarpal neck's extending into the midshaft. There does not appear to be any intra-articular extension of the fractures. Fifth metacarpal shaft fracture appears to have subtle shortening when compared to previous x-rays. There is subtle increase in callus summation along the fourth and fifth metacarpal shaft. Fracture is not healed at this time.  Impression: Closed displaced fracture of base of fifth metacarpal bone of right hand with routine healing, subsequent encounter [S62.316D] Closed displaced fracture of base of fifth metacarpal bone of right hand with routine healing, subsequent encounter (primary encounter diagnosis) Closed displaced fracture of base of fourth metacarpal bone of left hand with routine healing, subsequent encounter  Plan:  1. Continue with removable Velcro boxer splint. Work on gentle range of motion exercises. Referral placed to hand therapy. Although there is minimal callus on x-ray, patient is nontender on exam. Will continue to protect with splint but also start increasing range of motion exercises 2. Follow-up with Dr. Rosita Kea in 6 weeks for repeat x-rays of the right hand.   PRECAUTIONS: None    WEIGHT BEARING RESTRICTIONS: No  PAIN:  Are you having pain? 0/10 at rest - 17/10 when trying to move or bend ring and pinkie  FALLS: Has patient fallen in last 6 months? 1  LIVING ENVIRONMENT: Lives with: Son  lives with her  PLOF: Had normal active range of motion and strength in left hand.  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  NEXT MD VISIT: 06/14/2023  OBJECTIVE:  Note: Objective measures were completed at Evaluation unless otherwise noted.  HAND DOMINANCE: Right  ADLs: Wearing splint 75% of the time.  Not able to grip or pick up or pull or push anything with left  hand  FUNCTIONAL OUTCOME MEASURES: PRWHE for pain 30/50 and function 36/50  UPPER EXTREMITY ROM:     Active ROM Right eval Left eval L  05/17/23 L  05/21/23 L 05/24/23  Shoulder flexion       Shoulder abduction       Shoulder adduction       Shoulder extension       Shoulder internal rotation       Shoulder external rotation       Elbow flexion       Elbow extension       Wrist flexion  54 74 74 65  Wrist extension  54 58 50 80  Wrist ulnar deviation  WNL     Wrist radial deviation  WNL     Wrist pronation  WNL     Wrist supination  WNL     (Blank rows = not tested)  Active ROM Right eval Left eval L 05/17/23 L 05/21/23 L 05/24/23  Thumb MCP (0-60)       Thumb IP (0-80)       Thumb Radial abd/add (0-55)  WNL      Thumb Palmar abd/add (0-45)  WNL      Thumb Opposition to Small Finger   base of 5th      Index MCP (0-90)  70  80 75 80  Index PIP (0-100)   80 95 95 90  Index DIP (0-70)         Long MCP (0-90)    75 75 75 80  Long PIP (0-100)    90 95 95 95  Long DIP (0-70)         Ring MCP (0-90)    40 44 50 60  Ring PIP (0-100)    70 65 70 75  Ring DIP (0-70)         Little MCP (0-90)    40 35 50 50  Little PIP (0-100)    50 60 50 60  Little DIP (0-70)         (Blank rows = not tested)  HAND FUNCTION: Grip strength: Right:   lbs; Left:   lbs, Lateral pinch: Right:   lbs, Left:   lbs, and 3 point pinch: Right:   lbs, Left:   lbs  COORDINATION: Decrease still wearing ulnar gutter splint for protection 75% of the time  SENSATION: Reports no sensation issues  EDEMA: Over ulnar side of hand at fourth and fifth metacarpals  COGNITION: Overall cognitive status: Within functional limits for tasks assessed   TREATMENT DATE: 05/31/23  Pt reports she has been consistently doing her exercises and also using contrast to assist with edema management.   She is going to go visit her brother in the nursing home today.   Modalities: Fluidotherapy:  Time: 10 Location: Hand and wrist left  Active range of motion for wrist flexion extension as well as digits metacarpal flexion and restrictive fist and composite fist prior to review of home exercises to decrease stiffness-2 cycles of ice to decrease edema and pain.  Manual therapy:  Following fluidotherapy, therapist performed Soft tissue mobilization to hand and wrist along with using Graston #2 for brushing and sweeping over forearm prior to wrist flexion extension as well as volar hand and volar digits   Therapeutic Exercises:   wrist flexion extension 12 reps active assisted range of motion Patient to continue with hand-based ulnar gutter splint with the wrist not included as well as fourth PIP/DIP.   Continue to encourage patient to do at least every hour while doing her job as Comptroller some MCP active assisted flexion followed by passive range of motion to DIP PIP flexion, followed by blocked intrinsic a fist. Composite fist to large foam roller 12 reps, continued with progression to smaller roller this date, with difficulty. Crunching washcloth on tabletop to activate all finger flexors. Gentle active assisted range of motion MC flexion to 90 degrees 10 reps Gentle passive range of motion to DIP and PIP of all digits, continued  focus on fourth and fifth digits prior to intrinsic assist blocked 12 reps Continue with buddy strap for patient to use force to third digit to facilitate increased active motion to the fourth during the day Opposition to all digits. Towel rolling and crunches with washcloth to facilitate digit flexion, perform with home program  HEP: Pt currently performing contrast 3 times a day Working towards composite fisting working towards smaller roller Continue to keep pain 3/10 or less HEP to be performed throughout the day   PATIENT EDUCATION: Education details:  findings of eval and HEP  Person educated: Patient Education method: Explanation, Demonstration, Tactile cues, Verbal cues, and Handouts Education comprehension: verbalized understanding, returned demonstration, verbal cues required, and needs further education    GOALS: Goals reviewed with patient? Yes   LONG TERM GOALS: Target date: 8 wks  Patient to be independent in home program to increase left wrist active range of motion flexion extension within normal limits Baseline: Patient arrived with a prefab ulnar gutter splint on wrist flexion extension 54 degrees with a slight pain-wearing prefab 75% the time as well as sleeping Goal status: INITIAL  2.  Patient to be independent in home program to decrease edema and pain and increase motion in left hand digits to touch palm symptom-free Baseline: MC flexion in left hand 40 degrees to 75 and PIP 50 to 90 degrees.  With fifth digit worse than fourth.  No knowledge of home program. Goal status: INITIAL  3.  Fabricated patient a custom ulnar gutter splint to allow wrist and DIP PIP AROM. Baseline: Patient has a prefab ulnar gutter splint that immobilizes the wrist as well as all digits on the fourth and fifth.  Wearing at 75% of the time. Goal status: INITIAL  4.  After the patient's next orthopedic visit initiate strengthening to return to within range for her age for grip and prehension. Baseline: Will assess grip and prehension after next orthopedic visit to confirm healing of fracture site with x-ray-last x-ray did not show callus formation Goal status: INITIAL  ASSESSMENT:  CLINICAL IMPRESSION: Patient seen in OT  for left fourth and fifth metacarpal fractures.  Injury was 03/15/2023.  Patient was immobilized in a ulnar gutter splint-prefab that also included wrist and DIP PIP.  Patient wearing custom ulnar gutter splint allowing active range of motion at wrist and DIP PIP at 4th and 5th   Patient reports wearing it more than 50%  during the day as well as nighttime.  Recommend for patient when sitting doing her job as Comptroller to remove it and trying do home exercises about every hour keeping pain under 3/10.  Patient continues to show progress in flexion of all digits as well as wrist flexion in the session. Pt responds well to use of foam roller for targeted flexion of digits.  Decreased pain overall and increased motion.  Continues to respond well to use of fluidotherapy in combination with alternating with ice for a contrast effect to impact edema.  Continue to show decrease ROM and  strength in left hand and wrist.  Patient limited in functional use of left hand in ADLs and IADLs.  Patient can benefit from skilled OT services to decrease edema pain increase motion and strength to return to prior level of function.  PERFORMANCE DEFICITS: in functional skills including ADLs, IADLs, ROM, strength, pain, flexibility, decreased knowledge of use of DME, and UE functional use,   and psychosocial skills including environmental adaptation and routines and behaviors.   IMPAIRMENTS: are limiting patient from ADLs, IADLs, rest and sleep, play, leisure, and social participation.   COMORBIDITIES: has no other co-morbidities that affects occupational performance. Patient will benefit from skilled OT to address above impairments and improve overall function.  MODIFICATION OR ASSISTANCE TO COMPLETE EVALUATION: No modification of tasks or assist necessary to complete an evaluation.  OT OCCUPATIONAL PROFILE AND HISTORY: Problem focused assessment: Including review of records relating to presenting problem.  CLINICAL DECISION MAKING: LOW - limited treatment options, no task modification necessary  REHAB POTENTIAL: Good for goals  EVALUATION COMPLEXITY: Low   PLAN:  OT FREQUENCY: 1-2x/week  OT DURATION: 8 weeks  PLANNED INTERVENTIONS: 97168 OT Re-evaluation, 97535 self care/ADL training, 09811 therapeutic exercise, 97530 therapeutic  activity, 97112 neuromuscular re-education, 97140 manual therapy, 97018 paraffin, 91478 fluidotherapy, 97034 contrast bath, 97760 Orthotics management and training, 29562 Splinting (initial encounter), passive range of motion, patient/family education, and DME and/or AE instructions  CONSULTED AND AGREED WITH PLAN OF CARE: Patient   Jacarius Handel, OTR/L,CLT 06/04/2023, 7:12 PM

## 2023-06-05 ENCOUNTER — Ambulatory Visit: Admitting: Occupational Therapy

## 2023-06-05 DIAGNOSIS — M25642 Stiffness of left hand, not elsewhere classified: Secondary | ICD-10-CM

## 2023-06-05 DIAGNOSIS — M6281 Muscle weakness (generalized): Secondary | ICD-10-CM

## 2023-06-05 DIAGNOSIS — M79642 Pain in left hand: Secondary | ICD-10-CM

## 2023-06-05 DIAGNOSIS — R600 Localized edema: Secondary | ICD-10-CM

## 2023-06-05 NOTE — Therapy (Signed)
 OUTPATIENT OCCUPATIONAL THERAPY ORTHO TREATMENT  Patient Name: Debra Alexander MRN: [id number] DOB:April 03, 1950, 73 y.o., female Today's Date: 06/05/2023  PCP: Rolan Lipa NP REFERRING PROVIDER: Floyce Stakes PA  END OF SESSION:  OT End of Session - 06/05/23 0906     Visit Number 7    Number of Visits 12    Date for OT Re-Evaluation 06/04/23    OT Start Time 0906    OT Stop Time 1000    OT Time Calculation (min) 54 min    Activity Tolerance Patient tolerated treatment well    Behavior During Therapy [facility] for tasks assessed/performed             Past Medical History:  Diagnosis Date   Hyperlipidemia    Hypertension    Osteoporosis    Past Surgical History:  Procedure Laterality Date   ABDOMINAL HYSTERECTOMY     BREAST BIOPSY Right 08/07/2022   Stereo Bx, X clip- path pending   BREAST BIOPSY Right 08/07/2022   MM RT BREAST BX W LOC DEV 1ST LESION IMAGE BX SPEC STEREO GUIDE 08/07/2022 [facility]   BREAST CYST ASPIRATION Left 2007   neg   There are no active problems to display for this patient.   ONSET DATE: 03/15/23  REFERRING DIAG: Fx of L 4th and 5th MC   THERAPY DIAG:  Stiffness of left hand, not elsewhere classified  Pain in left hand  Localized edema  Muscle weakness (generalized)  Rationale for Evaluation and Treatment: Rehabilitation  SUBJECTIVE:   SUBJECTIVE STATEMENT: The pinky still hurts when I work it during the day.  Still stiff.  My wrist feels better. Pt accompanied by: self  PERTINENT HISTORY:  Ortho NOTE [place] 05/09/23: Left hand shows no swelling. No skin breakdown noted. No rotational deformity. She has some stiffness of the fourth and fifth digit but normal range of motion of the thumb index and middle finger. Patient lacks 3 cm of full composite fist of the ring finger, fifth digit she is able to get to 80 degrees of flexion at the MCP joint. Patient is nontender to palpation along the fracture sites.  AP lateral and oblique views of the  left hand are ordered interpreted by me in the office today. Impression: Patient has oblique fractures of the fourth and fifth metacarpal neck's extending into the midshaft. There does not appear to be any intra-articular extension of the fractures. Fifth metacarpal shaft fracture appears to have subtle shortening when compared to previous x-rays. There is subtle increase in callus summation along the fourth and fifth metacarpal shaft. Fracture is not healed at this time.  Impression: Closed displaced fracture of base of fifth metacarpal bone of right hand with routine healing, subsequent encounter [S62.316D] Closed displaced fracture of base of fifth metacarpal bone of right hand with routine healing, subsequent encounter (primary encounter diagnosis) Closed displaced fracture of base of fourth metacarpal bone of left hand with routine healing, subsequent encounter  Plan:  1. Continue with removable Velcro boxer splint. Work on gentle range of motion exercises. Referral placed to hand therapy. Although there is minimal callus on x-ray, patient is nontender on exam. Will continue to protect with splint but also start increasing range of motion exercises 2. Follow-up with Dr. Rosita Kea in 6 weeks for repeat x-rays of the right hand.   PRECAUTIONS: None    WEIGHT BEARING RESTRICTIONS: No  PAIN:  Are you having pain? 0/10 at rest - 7/10 when trying to move or bend ring and pinkie  FALLS:  Has patient fallen in last 6 months? 1  LIVING ENVIRONMENT: Lives with: Son lives with her  PLOF: Had normal active range of motion and strength in left hand.  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  NEXT MD VISIT: 06/14/2023  OBJECTIVE:  Note: Objective measures were completed at Evaluation unless otherwise noted.  HAND DOMINANCE: Right  ADLs: Wearing splint 75% of the time.   Not able to grip or pick up or pull or push anything with left hand  FUNCTIONAL OUTCOME MEASURES: PRWHE for pain 30/50 and function 36/50  UPPER EXTREMITY ROM:     Active ROM Right eval Left eval L  05/17/23 L  05/21/23 L 05/24/23  Shoulder flexion       Shoulder abduction       Shoulder adduction       Shoulder extension       Shoulder internal rotation       Shoulder external rotation       Elbow flexion       Elbow extension       Wrist flexion  54 74 74 65  Wrist extension  54 58 50 80  Wrist ulnar deviation  WNL     Wrist radial deviation  WNL     Wrist pronation  WNL     Wrist supination  WNL     (Blank rows = not tested)  Active ROM Right eval Left eval L 05/17/23 L 05/21/23 L 05/24/23 L 06/05/23  Thumb MCP (0-60)        Thumb IP (0-80)        Thumb Radial abd/add (0-55)  WNL       Thumb Palmar abd/add (0-45)  WNL       Thumb Opposition to Small Finger   base of 5th       Index MCP (0-90)  70  80 75 80 80  Index PIP (0-100)   80 95 95 90 100  Index DIP (0-70)          Long MCP (0-90)    75 75 75 80 80  Long PIP (0-100)    90 95 95 95 100  Long DIP (0-70)          Ring MCP (0-90)    40 44 50 60 60  Ring PIP (0-100)    70 65 70 75 80  Ring DIP (0-70)          Little MCP (0-90)    40 35 50 50 45  Little PIP (0-100)    50 60 50 60 60  Little DIP (0-70)          (Blank rows = not tested)  HAND FUNCTION: NT - last xray - showed not healed fx yet - Grip strength: Right:   lbs; Left:   lbs, Lateral pinch: Right:   lbs, Left:   lbs, and 3 point pinch: Right:   lbs, Left:   lbs  COORDINATION: Decrease still wearing ulnar gutter splint for protection 75% of the time   SENSATION: Reports no sensation issues  EDEMA: Over ulnar side of hand at fourth and fifth metacarpals  COGNITION: Overall cognitive status: Within functional limits for tasks assessed   TREATMENT DATE: 06/05/23     Patient arrive with continues increased stiffness in fourth  and fifth digit as  well as composite flexion for second and third. Decrease wrist extension and flexion with some discomfort. Patient about 10 to 11 weeks post injury Last x-ray did not show full healing still conservative treatment per PA Did get verbal order that can initiate light dynamic splint                                                                                                                       Modalities: Fluidotherapy:  Time: 10 Location: Hand and wrist left  Active range of motion for wrist flexion extension as well as digits metacarpal flexion and restrictive fist and composite fist prior to review of home exercises to decrease stiffness-2 cycles of ice to decrease edema and pain.  Manual therapy:  Following fluidotherapy, therapist performed Soft tissue mobilization to hand and wrist along with using Graston #2 for brushing   and sweeping over forearm prior to wrist flexion extension as well as volar hand and volar digits   Therapeutic Exercises:   Recommend contrast 3 times a day at least followed by wrist flexion extension 12 reps active assisted range of motion Patient to continue with hand-based ulnar gutter splint with the wrist not included as well as fourth PIP/DIP.   Patient to keep splint off during the day when sitting. Fit patient with a knuckle bender splint.  With 2 elastic bands on the radial side no pressure or bands on ulnar side Slight pull 1/10 over proximal digits to encourage MC flexion for 3 minutes at a time 5 times a day Followed by passive range of motion to lower bar keeping pain under 3/10 Followed by placing hold to bar Asymmetric done to fifth digit volarly to activate flexion.  Prior to placing hold Patient to do the same 5 times a day Crunching washcloth to activate all finger flexors.  Continue with buddy strap for patient to use force to third digit to facilitate increased active motion to the fourth during the day Continue with wrist AAROM in all  planes Followed and add to home program 1 pound weight for wrist flexion extension, radial ulnar deviation and supination pronation 12 reps each 2 times a day Patient to do in between second and third reps placing hold activating fourth and fifth digit flexion.    PATIENT EDUCATION: Education details: findings of eval and HEP  Person educated: Patient Education method: Explanation, Demonstration, Tactile cues, Verbal cues, and Handouts Education comprehension: verbalized understanding, returned demonstration, verbal cues required, and needs further education    GOALS: Goals reviewed with patient? Yes   LONG TERM GOALS: Target date: 8 wks  Patient to be independent in home program to increase left wrist active range of motion flexion extension within normal limits Baseline: Patient arrived with a prefab ulnar gutter splint on wrist flexion extension 54 degrees with a slight pain-wearing prefab 75% the time as well as sleeping Goal status: INITIAL  2.  Patient to be independent in home program to  decrease edema and pain and increase motion in left hand digits to touch palm symptom-free Baseline: MC flexion in left hand 40 degrees to 75 and PIP 50 to 90 degrees.  With fifth digit worse than fourth.  No knowledge of home program. Goal status: INITIAL  3.  Fabricated patient a custom ulnar gutter splint to allow wrist and DIP PIP AROM. Baseline: Patient has a prefab ulnar gutter splint that immobilizes the wrist as well as all digits on the fourth and fifth.  Wearing at 75% of the time. Goal status: INITIAL  4.  After the patient's next orthopedic visit initiate strengthening to return to within range for her age for grip and prehension. Baseline: Will assess grip and prehension after next orthopedic visit to confirm healing of fracture site with x-ray-last x-ray did not show callus formation Goal status: INITIAL  ASSESSMENT:  CLINICAL IMPRESSION: Patient seen in OT  for left fourth  and fifth metacarpal fractures.  Injury was 03/15/2023.  Patient was immobilized in a ulnar gutter splint-prefab that also included wrist and DIP PIP.  Patient wearing custom ulnar gutter splint allowing active range of motion at wrist and DIP PIP at 4th and 5th   Patient reports wearing it more than 50% during the day as well as nighttime.  Recommend for patient when sitting doing her job as Comptroller to remove it  -and at this date light dynamic splint for MC flexion to be done 5 times a day.  Patient is 10 weeks out of surgery but continues to have severe stiffness in the fifth and fourth.  Patient's last x-rays showed not fully healing and to continue conservative treatment.  Patient has orthopedic visit next week.  Did get a verbal order today from PA to initiate dynamic light splinting.  Patient to work in dynamic splint to place and hold for composite fist keeping pain under 1-2/10.  Did initiate 1 pound weight for wrist and forearm in all planes.   Decreased pain overall and increased motion.   Continue to show decrease ROM and  strength in left hand and wrist..  Patient limited in functional use of left hand in ADLs and IADLs.  Patient can benefit from skilled OT services to decrease edema pain increase motion and strength to return to prior level of function.  PERFORMANCE DEFICITS: in functional skills including ADLs, IADLs, ROM, strength, pain, flexibility, decreased knowledge of use of DME, and UE functional use,   and psychosocial skills including environmental adaptation and routines and behaviors.   IMPAIRMENTS: are limiting patient from ADLs, IADLs, rest and sleep, play, leisure, and social participation.   COMORBIDITIES: has no other co-morbidities that affects occupational performance. Patient will benefit from skilled OT to address above impairments and improve overall function.  MODIFICATION OR ASSISTANCE TO COMPLETE EVALUATION: No modification of tasks or assist necessary to complete an  evaluation.  OT OCCUPATIONAL PROFILE AND HISTORY: Problem focused assessment: Including review of records relating to presenting problem.  CLINICAL DECISION MAKING: LOW - limited treatment options, no task modification necessary  REHAB POTENTIAL: Good for goals  EVALUATION COMPLEXITY: Low   PLAN:  OT FREQUENCY: 1-2x/week  OT DURATION: 8 weeks  PLANNED INTERVENTIONS: 97168 OT Re-evaluation, 97535 self care/ADL training, 40981 therapeutic exercise, 97530 therapeutic activity, 97112 neuromuscular re-education, 97140 manual therapy, 97018 paraffin, 19147 fluidotherapy, 97034 contrast bath, 97760 Orthotics management and training, 82956 Splinting (initial encounter), passive range of motion, patient/family education, and DME and/or AE instructions  CONSULTED AND AGREED WITH PLAN OF  CARE: Patient   Oletta Cohn, OTR/L,CLT 06/05/2023, 12:15 PM

## 2023-06-08 ENCOUNTER — Ambulatory Visit: Admitting: Occupational Therapy

## 2023-06-08 ENCOUNTER — Encounter: Payer: Self-pay | Admitting: Occupational Therapy

## 2023-06-08 DIAGNOSIS — M25642 Stiffness of left hand, not elsewhere classified: Secondary | ICD-10-CM | POA: Diagnosis not present

## 2023-06-08 DIAGNOSIS — M6281 Muscle weakness (generalized): Secondary | ICD-10-CM

## 2023-06-08 DIAGNOSIS — R600 Localized edema: Secondary | ICD-10-CM

## 2023-06-08 DIAGNOSIS — M79642 Pain in left hand: Secondary | ICD-10-CM

## 2023-06-08 NOTE — Therapy (Signed)
 OUTPATIENT OCCUPATIONAL THERAPY ORTHO TREATMENT  Patient Name: Debra Alexander MRN: [id number] DOB:Jun 20, 1950, 73 y.o., female Today's Date: 06/08/2023  PCP: Rolan Lipa NP REFERRING PROVIDER: Floyce Stakes PA  END OF SESSION:  OT End of Session - 06/08/23 0820     Visit Number 8    Number of Visits 12    Date for OT Re-Evaluation 08/06/23    OT Start Time 0816    OT Stop Time 0900    OT Time Calculation (min) 44 min    Activity Tolerance Patient tolerated treatment well    Behavior During Therapy [facility] for tasks assessed/performed             Past Medical History:  Diagnosis Date   Hyperlipidemia    Hypertension    Osteoporosis    Past Surgical History:  Procedure Laterality Date   ABDOMINAL HYSTERECTOMY     BREAST BIOPSY Right 08/07/2022   Stereo Bx, X clip- path pending   BREAST BIOPSY Right 08/07/2022   MM RT BREAST BX W LOC DEV 1ST LESION IMAGE BX SPEC STEREO GUIDE 08/07/2022 [facility]   BREAST CYST ASPIRATION Left 2007   neg   There are no active problems to display for this patient.   ONSET DATE: 03/15/23  REFERRING DIAG: Fx of L 4th and 5th MC   THERAPY DIAG:  Stiffness of left hand, not elsewhere classified  Pain in left hand  Muscle weakness (generalized)  Localized edema  Rationale for Evaluation and Treatment: Rehabilitation  SUBJECTIVE:  See below for subjective.   SUBJECTIVE STATEMENT: The pinky still hurts when I work it during the day.  Still stiff.  My wrist feels better. Pt accompanied by: self  PERTINENT HISTORY:  Ortho NOTE [place] 05/09/23: Left hand shows no swelling. No skin breakdown noted. No rotational deformity. She has some stiffness of the fourth and fifth digit but normal range of motion of the thumb index and middle finger. Patient lacks 3 cm of full composite fist of the ring finger, fifth digit she is able to get to 80 degrees of flexion at the MCP joint. Patient is nontender to palpation along the fracture sites.  AP lateral  and oblique views of the left hand are ordered interpreted by me in the office today. Impression: Patient has oblique fractures of the fourth and fifth metacarpal neck's extending into the midshaft. There does not appear to be any intra-articular extension of the fractures. Fifth metacarpal shaft fracture appears to have subtle shortening when compared to previous x-rays. There is subtle increase in callus summation along the fourth and fifth metacarpal shaft. Fracture is not healed at this time.  Impression: Closed displaced fracture of base of fifth metacarpal bone of right hand with routine healing, subsequent encounter [S62.316D] Closed displaced fracture of base of fifth metacarpal bone of right hand with routine healing, subsequent encounter (primary encounter diagnosis) Closed displaced fracture of base of fourth metacarpal bone of left hand with routine healing, subsequent encounter  Plan:  1. Continue with removable Velcro boxer splint. Work on gentle range of motion exercises. Referral placed to hand therapy. Although there is minimal callus on x-ray, patient is nontender on exam. Will continue to protect with splint but also start increasing range of motion exercises 2. Follow-up with Dr. Rosita Kea in 6 weeks for repeat x-rays of the right hand.   PRECAUTIONS: None  WEIGHT BEARING RESTRICTIONS: No  PAIN:  Are you having pain? 0/10 at rest - 7/10 when trying to move or bend ring and  pinkie  FALLS: Has patient fallen in last 6 months? 1  LIVING ENVIRONMENT: Lives with: Son lives with her  PLOF: Had normal active range of motion and strength in left hand.  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  NEXT MD VISIT: 06/14/2023  OBJECTIVE:  Note: Objective measures were completed at Evaluation unless otherwise noted.  HAND DOMINANCE: Right  ADLs: Wearing  splint 75% of the time.  Not able to grip or pick up or pull or push anything with left hand  FUNCTIONAL OUTCOME MEASURES: PRWHE for pain 30/50 and function 36/50  UPPER EXTREMITY ROM:     Active ROM Right eval Left eval L  05/17/23 L  05/21/23 L 05/24/23  Shoulder flexion       Shoulder abduction       Shoulder adduction       Shoulder extension       Shoulder internal rotation       Shoulder external rotation       Elbow flexion       Elbow extension       Wrist flexion  54 74 74 80  Wrist extension  54 58 50 65  Wrist ulnar deviation  WNL     Wrist radial deviation  WNL     Wrist pronation  WNL     Wrist supination  WNL     (Blank rows = not tested)  Active ROM Right eval Left eval L 05/17/23 L 05/21/23 L 05/24/23 L 06/05/23 L 06/08/23  Thumb MCP (0-60)         Thumb IP (0-80)         Thumb Radial abd/add (0-55)  WNL        Thumb Palmar abd/add (0-45)  WNL        Thumb Opposition to Small Finger   base of 5th        Index MCP (0-90)  70  80 75 80 80 80  Index PIP (0-100)   80 95 95 90 100 100  Index DIP (0-70)           Long MCP (0-90)    75 75 75 80 80 80  Long PIP (0-100)    90 95 95 95 100 100  Long DIP (0-70)           Ring MCP (0-90)    40 44 50 60 60 55  Ring PIP (0-100)    70 65 70 75 80 90  Ring DIP (0-70)           Little MCP (0-90)    40 35 50 50 45 44  Little PIP (0-100)    50 60 50 60 60 65  Little DIP (0-70)           (Blank rows = not tested)  HAND FUNCTION: NT - last xray - showed not healed fx yet - Grip strength: Right:   lbs; Left:   lbs, Lateral pinch: Right:   lbs, Left:   lbs, and 3 point pinch: Right:   lbs, Left:   lbs  COORDINATION: Decrease still wearing ulnar gutter splint for protection 75% of the time   SENSATION: Reports no sensation issues  EDEMA: Over ulnar side of hand at fourth and fifth metacarpals  COGNITION: Overall cognitive status: Within  functional limits for tasks assessed   TREATMENT DATE: 06/08/23  Pt states she  tolerated the knuckle bender splint well, was able to do 3 mins, 5 times a day.  Has a 1# weight at home.  No pain on arrival in left hand, reports increased pain when attempting to focus on bending.                                                                                                                      Modalities: Fluidotherapy:  Time: 10 Location: Hand and wrist left  Active range of motion for wrist flexion extension as well as digits metacarpal flexion and restrictive fist and composite fist prior to review of home exercises to decrease stiffness-2 cycles of ice to decrease edema and pain.  Manual therapy:  Following fluidotherapy, therapist performed Soft tissue mobilization to hand and wrist along with using Graston #2 for brushing   and sweeping over forearm prior to wrist flexion extension as well as volar hand and volar digits   Therapeutic Exercises:   Patient with a knuckle bender splint, difficulty tolerating initially however after a couple days she was able to tolerate one band on ulnar side.  With 2 elastic bands on the radial side, 1 bands on ulnar side.  Checked fit for knuckle bender and pt placed it on upside down, reinstruction on proper fit and issued insert with picture for reference at home.   Cont over proximal digits to encourage MC flexion for 3 minutes at a time 5 times a day Passive range of motion to lower bar keeping pain under 3/10 Followed by placing hold to bar Crunching washcloth to activate all finger flexors. Light gripping towards foam  in palm for 10 reps Pain remains 1/10 with all exercises.  Continue with buddy strap for patient to use force to third digit to facilitate increased active motion to the fourth during the day Continue with wrist AAROM in all planes 1 pound weight for wrist flexion extension, radial ulnar deviation and supination pronation 12 reps each 2 times a day, increase in a couple days to 2 sets of 10.   Recommend contrast  3 times a day at least followed by wrist flexion extension 12 reps active assisted range of motion Patient to continue with hand-based ulnar gutter splint with the wrist not included as well as fourth PIP/DIP.   Patient to keep splint off during the day when sitting.   PATIENT EDUCATION: Education details: findings of eval and HEP  Person educated: Patient Education method: Explanation, Demonstration, Tactile cues, Verbal cues, and Handouts Education comprehension: verbalized understanding, returned demonstration, verbal cues required, and needs further education    GOALS: Goals reviewed with patient? Yes   LONG TERM GOALS: Target date: 8 wks  Patient to be independent in home program to increase left wrist active range of motion flexion extension within normal limits Baseline: Patient arrived with a prefab ulnar gutter splint on wrist flexion extension 54 degrees with a slight  pain-wearing prefab 75% the time as well as sleeping Goal status: INITIAL  2.  Patient to be independent in home program to decrease edema and pain and increase motion in left hand digits to touch palm symptom-free Baseline: MC flexion in left hand 40 degrees to 75 and PIP 50 to 90 degrees.  With fifth digit worse than fourth.  No knowledge of home program. Goal status: INITIAL  3.  Fabricated patient a custom ulnar gutter splint to allow wrist and DIP PIP AROM. Baseline: Patient has a prefab ulnar gutter splint that immobilizes the wrist as well as all digits on the fourth and fifth.  Wearing at 75% of the time. Goal status: INITIAL  4.  After the patient's next orthopedic visit initiate strengthening to return to within range for her age for grip and prehension. Baseline: Will assess grip and prehension after next orthopedic visit to confirm healing of fracture site with x-ray-last x-ray did not show callus formation Goal status: INITIAL  ASSESSMENT:  CLINICAL IMPRESSION: Patient seen in OT  for left  fourth and fifth metacarpal fractures.  Injury was 03/15/2023.  Patient was immobilized in a ulnar gutter splint-prefab that also included wrist and DIP PIP.  Patient wearing custom ulnar gutter splint allowing active range of motion at wrist and DIP PIP at 4th and 5th   Patient reports wearing it more than 50% during the day as well as nighttime.  Recommend for patient when sitting doing her job as Comptroller to remove it  -and at this date light dynamic splint for MC flexion to be done 5 times a day.  Patient is 10 weeks out of surgery but continues to have severe stiffness in the fifth and fourth.  Patient's last x-rays showed not fully healing and to continue conservative treatment.  Patient has orthopedic visit next week.  Did get a verbal order today from PA to initiate dynamic light splinting.  Patient to continue to work in dynamic splint to place and hold for composite fist keeping pain under 1-2/10.  Continue with 1 pound weight for wrist and forearm in all planes, increase to 2 sets of 10.   Decreased pain overall and increased motion, increased functional use at times as an assist.  Continue to show decrease ROM and  strength in left hand and wrist..  Patient limited in functional use of left hand in ADLs and IADLs.  Patient can benefit from skilled OT services to decrease edema pain increase motion and strength to return to prior level of function.  PERFORMANCE DEFICITS: in functional skills including ADLs, IADLs, ROM, strength, pain, flexibility, decreased knowledge of use of DME, and UE functional use,   and psychosocial skills including environmental adaptation and routines and behaviors.   IMPAIRMENTS: are limiting patient from ADLs, IADLs, rest and sleep, play, leisure, and social participation.   COMORBIDITIES: has no other co-morbidities that affects occupational performance. Patient will benefit from skilled OT to address above impairments and improve overall function.  MODIFICATION OR  ASSISTANCE TO COMPLETE EVALUATION: No modification of tasks or assist necessary to complete an evaluation.  OT OCCUPATIONAL PROFILE AND HISTORY: Problem focused assessment: Including review of records relating to presenting problem.  CLINICAL DECISION MAKING: LOW - limited treatment options, no task modification necessary  REHAB POTENTIAL: Good for goals  EVALUATION COMPLEXITY: Low   PLAN:  OT FREQUENCY: 1-2x/week  OT DURATION: 8 weeks  PLANNED INTERVENTIONS: 97168 OT Re-evaluation, 97535 self care/ADL training, 40981 therapeutic exercise, 97530 therapeutic activity, 97112 neuromuscular  re-education, 97140 manual therapy, 97018 paraffin, 10272 fluidotherapy, 97034 contrast bath, [phone] Orthotics management and training, 40347 Splinting (initial encounter), passive range of motion, patient/family education, and DME and/or AE instructions  CONSULTED AND AGREED WITH PLAN OF CARE: Patient   Erol Flanagin, OTR/L,CLT 06/08/2023, 8:09 PM

## 2023-06-12 ENCOUNTER — Ambulatory Visit: Admitting: Occupational Therapy

## 2023-06-12 DIAGNOSIS — R600 Localized edema: Secondary | ICD-10-CM

## 2023-06-12 DIAGNOSIS — M25642 Stiffness of left hand, not elsewhere classified: Secondary | ICD-10-CM

## 2023-06-12 DIAGNOSIS — M6281 Muscle weakness (generalized): Secondary | ICD-10-CM

## 2023-06-12 DIAGNOSIS — M79642 Pain in left hand: Secondary | ICD-10-CM

## 2023-06-12 NOTE — Therapy (Signed)
 OUTPATIENT OCCUPATIONAL THERAPY ORTHO TREATMENT  Patient Name: JOELYNN Alexander MRN: [id number] DOB:01-02-1951, 73 y.o., female Today's Date: 06/12/2023  PCP: Rolan Lipa NP REFERRING PROVIDER: Floyce Stakes PA  END OF SESSION:  OT End of Session - 06/12/23 0816     Visit Number 9    Number of Visits 12    Date for OT Re-Evaluation 08/06/23    OT Start Time 0816    OT Stop Time 0905    OT Time Calculation (min) 49 min    Activity Tolerance Patient tolerated treatment well    Behavior During Therapy [facility] for tasks assessed/performed             Past Medical History:  Diagnosis Date   Hyperlipidemia    Hypertension    Osteoporosis    Past Surgical History:  Procedure Laterality Date   ABDOMINAL HYSTERECTOMY     BREAST BIOPSY Right 08/07/2022   Stereo Bx, X clip- path pending   BREAST BIOPSY Right 08/07/2022   MM RT BREAST BX W LOC DEV 1ST LESION IMAGE BX SPEC STEREO GUIDE 08/07/2022 [facility]   BREAST CYST ASPIRATION Left 2007   neg   There are no active problems to display for this patient.   ONSET DATE: 03/15/23  REFERRING DIAG: Fx of L 4th and 5th MC   THERAPY DIAG:  Stiffness of left hand, not elsewhere classified  Pain in left hand  Muscle weakness (generalized)  Localized edema  Rationale for Evaluation and Treatment: Rehabilitation  SUBJECTIVE:  See below for subjective.   SUBJECTIVE STATEMENT: See daily note Pt accompanied by: self  PERTINENT HISTORY:  Ortho NOTE [place] 05/09/23: Left hand shows no swelling. No skin breakdown noted. No rotational deformity. She has some stiffness of the fourth and fifth digit but normal range of motion of the thumb index and middle finger. Patient lacks 3 cm of full composite fist of the ring finger, fifth digit she is able to get to 80 degrees of flexion at the MCP joint. Patient is nontender to palpation along the fracture sites.  AP lateral and oblique views of the left hand are ordered interpreted by me in the  office today. Impression: Patient has oblique fractures of the fourth and fifth metacarpal neck's extending into the midshaft. There does not appear to be any intra-articular extension of the fractures. Fifth metacarpal shaft fracture appears to have subtle shortening when compared to previous x-rays. There is subtle increase in callus summation along the fourth and fifth metacarpal shaft. Fracture is not healed at this time.  Impression: Closed displaced fracture of base of fifth metacarpal bone of right hand with routine healing, subsequent encounter [S62.316D] Closed displaced fracture of base of fifth metacarpal bone of right hand with routine healing, subsequent encounter (primary encounter diagnosis) Closed displaced fracture of base of fourth metacarpal bone of left hand with routine healing, subsequent encounter  Plan:  1. Continue with removable Velcro boxer splint. Work on gentle range of motion exercises. Referral placed to hand therapy. Although there is minimal callus on x-ray, patient is nontender on exam. Will continue to protect with splint but also start increasing range of motion exercises 2. Follow-up with Dr. Rosita Kea in 6 weeks for repeat x-rays of the right hand.   PRECAUTIONS: None  WEIGHT BEARING RESTRICTIONS: No  PAIN:  Are you having pain?  No pain just tightness over dorsal hand with flexion extension  FALLS: Has patient fallen in last 6 months? 1  LIVING ENVIRONMENT: Lives with: Son lives with  her  PLOF: Had normal active range of motion and strength in left hand.  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  NEXT MD VISIT: 06/14/2023  OBJECTIVE:  Note: Objective measures were completed at Evaluation unless otherwise noted.  HAND DOMINANCE: Right  ADLs: Wearing splint 75% of the time.  Not able to grip or pick up or pull or push  anything with left hand  FUNCTIONAL OUTCOME MEASURES: PRWHE for pain 30/50 and function 36/50  UPPER EXTREMITY ROM:     Active ROM Right eval Left eval L  05/17/23 L  05/21/23 L 05/24/23  Shoulder flexion       Shoulder abduction       Shoulder adduction       Shoulder extension       Shoulder internal rotation       Shoulder external rotation       Elbow flexion       Elbow extension       Wrist flexion  54 74 74 80  Wrist extension  54 58 50 65  Wrist ulnar deviation  WNL     Wrist radial deviation  WNL     Wrist pronation  WNL     Wrist supination  WNL     (Blank rows = not tested)  Active ROM Right eval Left eval L 05/17/23 L 05/21/23 L 05/24/23 L 06/05/23 L 06/08/23 L 06/12/23  Thumb MCP (0-60)          Thumb IP (0-80)          Thumb Radial abd/add (0-55)  WNL         Thumb Palmar abd/add (0-45)  WNL         Thumb Opposition to Small Finger   base of 5th         Index MCP (0-90)  70  80 75 80 80 80   Index PIP (0-100)   80 95 95 90 100 100   Index DIP (0-70)            Long MCP (0-90)    75 75 75 80 80 80   Long PIP (0-100)    90 95 95 95 100 100   Long DIP (0-70)            Ring MCP (0-90)    40 44 50 60 60 55 70  Ring PIP (0-100)    70 65 70 75 80 90 90  Ring DIP (0-70)            Little MCP (0-90)    40 35 50 50 45 44 55  Little PIP (0-100)    50 60 50 60 60 65 75  Little DIP (0-70)            (Blank rows = not tested)  HAND FUNCTION: NT - last xray - showed not healed fx yet - Grip strength: Right:   lbs; Left:   lbs, Lateral pinch: Right:   lbs, Left:   lbs, and 3 point pinch: Right:   lbs, Left:   lbs  COORDINATION: Decrease still wearing ulnar gutter splint for protection 75% of the time   SENSATION: Reports no sensation issues  EDEMA: Over ulnar side of hand at fourth and fifth metacarpals  COGNITION: Overall cognitive status: Within  functional limits for tasks assessed   TREATMENT DATE: 06/12/23  Patient report pain better and using the splint  to protect at night time and when using  my hand  Pt states she tolerated the knuckle bender splint well, was able to do 3 mins, 5 times a day.   Has a 1# weight at home.   Active range of motion assessed in left hand.  Patient showing increased motion with use of knuckle bender.                                                                                                                     Modalities: Paraffin  10 min  Location: Hand Left  Intrinsic fist stretch - to increase ROM and decrease pain and stiffness SOC -with focus on PIP/DIP flexion  Manual therapy:  Following paraffin Soft tissue mobilization to hand and wrist along with using Graston #2 for brushing   and sweeping over forearm prior to wrist flexion extension as well as volar hand and volar digits   Therapeutic Exercises:   Patient to continue with knuckle bender splint, she is able to tolerate 5 times a day for 3 to 5 minutes.  Using 3 bands on the radial side 2 bands on the ulnar side.  To increase MC flexion while working on active assisted and passive range of motion composite flexion Focus this date on passive range of motion to DIP and PIPs.  With a placing hold to highlighter. Gentle active assisted range of motion to MCP flexion followed by placing hold to high lateral. Patient to continue the same.  At home.  Followed by individual tendon glides and digit flexion extensions.  Making different signs with fingers. Facilitate functional motor patterns with opposition thumb to second and third digit and thumb to fourth and fifth digits From palm 10 reps add to home exercises Pick up hold objects for storage with fifth and fourth digit while using second third for fine motor pick up. Patient difficulty with all activities add to home exercises  Encourage patient to do same exercises with the right hand for encouraging motor pattern and proprioception to facilitate increased active range of motion Crunching washcloth  to activate all finger flexors.  1 pound weight for wrist flexion extension, radial ulnar deviation and supination pronation 2 sets of 12 reps each 2 times a day,  Patient to use buddy strap in place and hold around weights several times.    PATIENT EDUCATION: Education details: findings of eval and HEP  Person educated: Patient Education method: Explanation, Demonstration, Tactile cues, Verbal cues, and Handouts Education comprehension: verbalized understanding, returned demonstration, verbal cues required, and needs further education    GOALS: Goals reviewed with patient? Yes   LONG TERM GOALS: Target date: 8 wks  Patient to be independent in home program to increase left wrist active range of motion flexion extension within normal limits Baseline: Patient arrived with a prefab ulnar gutter splint on wrist flexion extension 54 degrees with a  slight pain-wearing prefab 75% the time as well as sleeping Goal status: INITIAL  2.  Patient to be independent in home program to decrease edema and pain and increase motion in left hand digits to touch palm symptom-free Baseline: MC flexion in left hand 40 degrees to 75 and PIP 50 to 90 degrees.  With fifth digit worse than fourth.  No knowledge of home program. Goal status: INITIAL  3.  Fabricated patient a custom ulnar gutter splint to allow wrist and DIP PIP AROM. Baseline: Patient has a prefab ulnar gutter splint that immobilizes the wrist as well as all digits on the fourth and fifth.  Wearing at 75% of the time. Goal status: INITIAL  4.  After the patient's next orthopedic visit initiate strengthening to return to within range for her age for grip and prehension. Baseline: Will assess grip and prehension after next orthopedic visit to confirm healing of fracture site with x-ray-last x-ray did not show callus formation Goal status: INITIAL  ASSESSMENT:  CLINICAL IMPRESSION: Patient seen in OT  for left fourth and fifth metacarpal  fractures.  Injury was 03/15/2023.  Patient was immobilized in a ulnar gutter splint-prefab that also included wrist and DIP PIP.  Patient wearing custom ulnar gutter splint allowing active range of motion at wrist and DIP PIP at 4th and 5th- okayed by PA   Patient reports wearing it more  at nighttime and with .activities.  Recommend for patient when sitting doing her job as Comptroller to remove it  - 5 times a day work on knuckle bender to facilitate increased MC flexion while working on composite flexion in splint-Did get a verbal order from PA to initiate dynamic light splinting.  Patient able to tolerate 2-3 bands of light dynamic stretch.  Patient arrived this date with increased flexion and fifth digit.  Focus on functional motor patterns encouraging using hand including fifth digit for flexion and storage of objects as well as opposition and manipulation of objects palm to fingertips.  Add to home program.  Continue with 1 pound weight for wrist and forearm in all planes, 2 sets of 10.   Decreased pain overall and increased motion, increased functional use at times as an assist.  Continue to show decrease ROM and  strength in left hand and wrist..  Patient limited in functional use of left hand in ADLs and IADLs.  Patient can benefit from skilled OT services to decrease edema pain increase motion and strength to return to prior level of function.  PERFORMANCE DEFICITS: in functional skills including ADLs, IADLs, ROM, strength, pain, flexibility, decreased knowledge of use of DME, and UE functional use,   and psychosocial skills including environmental adaptation and routines and behaviors.   IMPAIRMENTS: are limiting patient from ADLs, IADLs, rest and sleep, play, leisure, and social participation.   COMORBIDITIES: has no other co-morbidities that affects occupational performance. Patient will benefit from skilled OT to address above impairments and improve overall function.  MODIFICATION OR ASSISTANCE  TO COMPLETE EVALUATION: No modification of tasks or assist necessary to complete an evaluation.  OT OCCUPATIONAL PROFILE AND HISTORY: Problem focused assessment: Including review of records relating to presenting problem.  CLINICAL DECISION MAKING: LOW - limited treatment options, no task modification necessary  REHAB POTENTIAL: Good for goals  EVALUATION COMPLEXITY: Low   PLAN:  OT FREQUENCY: 1-2x/week  OT DURATION: 8 weeks  PLANNED INTERVENTIONS: 97168 OT Re-evaluation, 97535 self care/ADL training, 40981 therapeutic exercise, 97530 therapeutic activity, 97112 neuromuscular re-education, 97140 manual  therapy, 97018 paraffin, 78295 fluidotherapy, 97034 contrast bath, [id number] Orthotics management and training, 86578 Splinting (initial encounter), passive range of motion, patient/family education, and DME and/or AE instructions  CONSULTED AND AGREED WITH PLAN OF CARE: Patient   Oletta Cohn, OTR/L,CLT 06/12/2023, 11:49 AM

## 2023-06-13 ENCOUNTER — Other Ambulatory Visit: Payer: Self-pay | Admitting: Gerontology

## 2023-06-13 DIAGNOSIS — Z1231 Encounter for screening mammogram for malignant neoplasm of breast: Secondary | ICD-10-CM

## 2023-06-15 ENCOUNTER — Ambulatory Visit: Admitting: Occupational Therapy

## 2023-06-15 DIAGNOSIS — M79642 Pain in left hand: Secondary | ICD-10-CM

## 2023-06-15 DIAGNOSIS — M6281 Muscle weakness (generalized): Secondary | ICD-10-CM

## 2023-06-15 DIAGNOSIS — M25642 Stiffness of left hand, not elsewhere classified: Secondary | ICD-10-CM | POA: Diagnosis not present

## 2023-06-15 DIAGNOSIS — R600 Localized edema: Secondary | ICD-10-CM

## 2023-06-15 NOTE — Therapy (Signed)
 OUTPATIENT OCCUPATIONAL THERAPY ORTHO TREATMENT  Patient Name: Debra Alexander MRN: [id number] DOB:1950/10/24, 73 y.o., female Today's Date: 06/15/2023  PCP: Rolan Lipa NP REFERRING PROVIDER: Floyce Stakes PA  END OF SESSION:  OT End of Session - 06/15/23 0859     Visit Number 10    Number of Visits 12    Date for OT Re-Evaluation 08/06/23    OT Start Time 0900    OT Stop Time 0944    OT Time Calculation (min) 44 min    Activity Tolerance Patient tolerated treatment well    Behavior During Therapy [facility] for tasks assessed/performed             Past Medical History:  Diagnosis Date   Hyperlipidemia    Hypertension    Osteoporosis    Past Surgical History:  Procedure Laterality Date   ABDOMINAL HYSTERECTOMY     BREAST BIOPSY Right 08/07/2022   Stereo Bx, X clip- path pending   BREAST BIOPSY Right 08/07/2022   MM RT BREAST BX W LOC DEV 1ST LESION IMAGE BX SPEC STEREO GUIDE 08/07/2022 [facility]   BREAST CYST ASPIRATION Left 2007   neg   There are no active problems to display for this patient.   ONSET DATE: 03/15/23  REFERRING DIAG: Fx of L 4th and 5th MC   THERAPY DIAG:  Stiffness of left hand, not elsewhere classified  Pain in left hand  Muscle weakness (generalized)  Localized edema  Rationale for Evaluation and Treatment: Rehabilitation  SUBJECTIVE:  See below for subjective.   SUBJECTIVE STATEMENT: See daily note Pt accompanied by: self  PERTINENT HISTORY:  Ortho NOTE [place] 05/09/23: Left hand shows no swelling. No skin breakdown noted. No rotational deformity. She has some stiffness of the fourth and fifth digit but normal range of motion of the thumb index and middle finger. Patient lacks 3 cm of full composite fist of the ring finger, fifth digit she is able to get to 80 degrees of flexion at the MCP joint. Patient is nontender to palpation along the fracture sites.  AP lateral and oblique views of the left hand are ordered interpreted by me in the  office today. Impression: Patient has oblique fractures of the fourth and fifth metacarpal neck's extending into the midshaft. There does not appear to be any intra-articular extension of the fractures. Fifth metacarpal shaft fracture appears to have subtle shortening when compared to previous x-rays. There is subtle increase in callus summation along the fourth and fifth metacarpal shaft. Fracture is not healed at this time.  Impression: Closed displaced fracture of base of fifth metacarpal bone of right hand with routine healing, subsequent encounter [S62.316D] Closed displaced fracture of base of fifth metacarpal bone of right hand with routine healing, subsequent encounter (primary encounter diagnosis) Closed displaced fracture of base of fourth metacarpal bone of left hand with routine healing, subsequent encounter  Plan:  1. Continue with removable Velcro boxer splint. Work on gentle range of motion exercises. Referral placed to hand therapy. Although there is minimal callus on x-ray, patient is nontender on exam. Will continue to protect with splint but also start increasing range of motion exercises 2. Follow-up with Dr. Rosita Kea in 6 weeks for repeat x-rays of the right hand.   PRECAUTIONS: None  WEIGHT BEARING RESTRICTIONS: No  PAIN:  Are you having pain?  No pain just tightness over dorsal hand with flexion extension  FALLS: Has patient fallen in last 6 months? 1  LIVING ENVIRONMENT: Lives with: Son lives with  her  PLOF: Had normal active range of motion and strength in left hand.  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  NEXT MD VISIT: 06/14/2023  OBJECTIVE:  Note: Objective measures were completed at Evaluation unless otherwise noted.  HAND DOMINANCE: Right  ADLs: Wearing splint 75% of the time.  Not able to grip or pick up or pull or push  anything with left hand  FUNCTIONAL OUTCOME MEASURES: PRWHE for pain 30/50 and function 36/50  UPPER EXTREMITY ROM:     Active ROM Right eval Left eval L  05/17/23 L  05/21/23 L 05/24/23 L 06/15/2578  Shoulder flexion        Shoulder abduction        Shoulder adduction        Shoulder extension        Shoulder internal rotation        Shoulder external rotation        Elbow flexion        Elbow extension        Wrist flexion  54 74 74 80 80  Wrist extension  54 58 50 65 70  Wrist ulnar deviation  WNL      Wrist radial deviation  WNL      Wrist pronation  WNL      Wrist supination  WNL      (Blank rows = not tested)  Active ROM Right eval Left eval L 05/17/23 L 05/21/23 L 05/24/23 L 06/05/23 L 06/08/23 L 06/12/23 L 06/15/23  Thumb MCP (0-60)           Thumb IP (0-80)           Thumb Radial abd/add (0-55)  WNL          Thumb Palmar abd/add (0-45)  WNL          Thumb Opposition to Small Finger   base of 5th          Index MCP (0-90)  70  80 75 80 80 80    Index PIP (0-100)   80 95 95 90 100 100    Index DIP (0-70)             Long MCP (0-90)    75 75 75 80 80 80    Long PIP (0-100)    90 95 95 95 100 100    Long DIP (0-70)             Ring MCP (0-90)    40 44 50 60 60 55 70 70  Ring PIP (0-100)    70 65 70 75 80 90 90 90  Ring DIP (0-70)             Little MCP (0-90)    40 35 50 50 45 44 55 55( in session 60)   Little PIP (0-100)    50 60 50 60 60 65 75 80 ( in session 85)  Little DIP (0-70)             (Blank rows = not tested)  HAND FUNCTION: NT - last xray - showed not healed fx yet - Grip strength: Right:   lbs; Left:   lbs, Lateral pinch: Right:   lbs, Left:   lbs, and 3 point pinch: Right:   lbs,  Left:   lbs  COORDINATION: Continue to be limited by stiffness but improving   SENSATION: Reports no sensation issues  EDEMA: Over ulnar side of hand at fourth and fifth metacarpals  COGNITION: Overall cognitive status: Within functional limits for tasks  assessed   TREATMENT DATE: 06/15/23  Patient reports seeing Dr. Rosita Kea yesterday.  Continues to be limited in healing. Pain is better.  But still stiff. Trying to use it more and light tasks. Can stop wearing the brace. Ask if can review knuckle Bender application do not feel it is pull anymore.  Applied knuckle bender reviewed with patient to make sure it is although in the palm with light pressure over dorsal proximal phalanges.  Slight pull.  4 bands on the ulnar side 1 on the radial side change 2.   Tolerated well for 3 minutes with increased ability to do passive range of motion to PIP joints to  palmar bar.    patient showed increased motion afterwards Patient to continue at home with 1 pound weight for wrist in all planes.   Active range of motion assessed in left hand.  Patient showing increased motion with use of knuckle bender.                                                                                                                     Modalities: Paraffin  10 min  Location: Hand Left Done light Coban flexion wrap to fourth and fifth digit  Provided patient with Coban to do light flexion compression wrap after knuckle bender prior to composite fisting.  Manual therapy:  Following paraffin Soft tissue mobilization to hand and wrist along with using Graston #2 for brushing   and sweeping over forearm prior to wrist flexion extension as well as volar hand and volar digits   Therapeutic Exercises:   Patient to continue with knuckle bender splint, she is able to tolerate 5 times a day for 3 to 5 minutes.  To increase MC flexion while working on active assisted and passive range of motion composite flexion Focus this date on passive range of motion to DIP and PIPs.   2 small ball of light blue putty. Gentle active assisted range of motion to MCP flexion followed by placing hold touching light blue putty Patient to continue the same.  At home.  Followed by individual tendon  glides and digit flexion extensions.  Making different signs with fingers. Facilitate functional motor patterns moving 2 cm Theraputty ball  opposition thumb to second and third digit and thumb to fourth and fifth digits From palm 10 reps add to home exercises Pick up hold 2 cm Theraputty ball for storage with fifth and fourth digit while using second third for fine motor pick up. Patient difficulty with all activities add to home exercises Patient to continue  Add light blue putty cylinder squeezing with 2nd through 4th while placing hold touching with fifth Tolerated well to do at the end of home exercises 12 reps  Encourage  patient to do same exercises with the right hand for encouraging motor pattern and proprioception to facilitate increased active range of motion Crunching washcloth to activate all finger flexors.  1 pound weight for wrist flexion extension, radial ulnar deviation and supination pronation 2 sets of 12 reps each 2 times a day,  Patient to use buddy strap in place and hold around weights several times.    PATIENT EDUCATION: Education details: findings of eval and HEP  Person educated: Patient Education method: Explanation, Demonstration, Tactile cues, Verbal cues, and Handouts Education comprehension: verbalized understanding, returned demonstration, verbal cues required, and needs further education    GOALS: Goals reviewed with patient? Yes   LONG TERM GOALS: Target date: 8 wks  Patient to be independent in home program to increase left wrist active range of motion flexion extension within normal limits Baseline: Patient arrived with a prefab ulnar gutter splint on wrist flexion extension 54 degrees with a slight pain-wearing prefab 75% the time as well as sleeping Goal status: INITIAL  2.  Patient to be independent in home program to decrease edema and pain and increase motion in left hand digits to touch palm symptom-free Baseline: MC flexion in left hand  40 degrees to 75 and PIP 50 to 90 degrees.  With fifth digit worse than fourth.  No knowledge of home program. Goal status: INITIAL  3.  Fabricated patient a custom ulnar gutter splint to allow wrist and DIP PIP AROM. Baseline: Patient has a prefab ulnar gutter splint that immobilizes the wrist as well as all digits on the fourth and fifth.  Wearing at 75% of the time. Goal status: INITIAL  4.  After the patient's next orthopedic visit initiate strengthening to return to within range for her age for grip and prehension. Baseline: Will assess grip and prehension after next orthopedic visit to confirm healing of fracture site with x-ray-last x-ray did not show callus formation Goal status: INITIAL  ASSESSMENT:  CLINICAL IMPRESSION: Patient seen in OT  for left fourth and fifth metacarpal fractures.  Injury was 03/15/2023.  Patient was immobilized in a ulnar gutter splint-prefab that also included wrist and DIP PIP.  Patient had a follow-up yesterday 06/14/2023 with orthopedics again but not fully healed yet.  But patient can remove splint.  Patient continue to show progress in flexion while maintaining extension of fourth and fifth digits.  Modified knuckle Bender splint bands to 4 on the ulnar side 1 on the radial side tolerating well.  Add small therapy balls for placing hold in light pinching for fourth and fifth digits.  Followed by cylinder roll of light blue Theraputty gripping with 2nd through 4th with a placing hold with the fifth.  Tolerating well patient to continue with home program Patient to continue with focus on functional motor patterns encouraging using hand including fifth digit for flexion and storage of objects as well as opposition and manipulation of objects palm to fingertips.  Add to home program.  Continue with 1 pound weight for wrist and forearm in all planes, 2 sets of 10.   Decreased pain overall and increased motion, increased functional use at times as an assist.  Continue  to show decrease ROM and  strength in left hand and wrist..  Patient limited in functional use of left hand in ADLs and IADLs.  Patient can benefit from skilled OT services to decrease edema pain increase motion and strength to return to prior level of function.  PERFORMANCE DEFICITS: in functional skills including  ADLs, IADLs, ROM, strength, pain, flexibility, decreased knowledge of use of DME, and UE functional use,   and psychosocial skills including environmental adaptation and routines and behaviors.   IMPAIRMENTS: are limiting patient from ADLs, IADLs, rest and sleep, play, leisure, and social participation.   COMORBIDITIES: has no other co-morbidities that affects occupational performance. Patient will benefit from skilled OT to address above impairments and improve overall function.  MODIFICATION OR ASSISTANCE TO COMPLETE EVALUATION: No modification of tasks or assist necessary to complete an evaluation.  OT OCCUPATIONAL PROFILE AND HISTORY: Problem focused assessment: Including review of records relating to presenting problem.  CLINICAL DECISION MAKING: LOW - limited treatment options, no task modification necessary  REHAB POTENTIAL: Good for goals  EVALUATION COMPLEXITY: Low   PLAN:  OT FREQUENCY: 1-2x/week  OT DURATION: 8 weeks  PLANNED INTERVENTIONS: 97168 OT Re-evaluation, 97535 self care/ADL training, 82956 therapeutic exercise, 97530 therapeutic activity, 97112 neuromuscular re-education, 97140 manual therapy, 97018 paraffin, 21308 fluidotherapy, 97034 contrast bath, 97760 Orthotics management and training, 65784 Splinting (initial encounter), passive range of motion, patient/family education, and DME and/or AE instructions  CONSULTED AND AGREED WITH PLAN OF CARE: Patient   Oletta Cohn, OTR/L,CLT 06/15/2023, 11:48 AM

## 2023-06-18 ENCOUNTER — Ambulatory Visit
Admit: 2023-06-18 | Discharge: 2023-06-19 | Attending: Student in an Organized Health Care Education/Training Program | Primary: Student in an Organized Health Care Education/Training Program

## 2023-06-18 DIAGNOSIS — J3089 Other allergic rhinitis: Principal | ICD-10-CM

## 2023-06-18 DIAGNOSIS — J310 Chronic rhinitis: Principal | ICD-10-CM

## 2023-06-18 DIAGNOSIS — J4521 Mild intermittent asthma with (acute) exacerbation: Principal | ICD-10-CM

## 2023-06-18 DIAGNOSIS — L209 Atopic dermatitis, unspecified: Principal | ICD-10-CM

## 2023-06-18 MED ORDER — BUDESONIDE-FORMOTEROL HFA 80 MCG-4.5 MCG/ACTUATION AEROSOL INHALER
Freq: Two times a day (BID) | RESPIRATORY_TRACT | 11 refills | 31 days | Status: CP
Start: 2023-06-18 — End: 2024-06-17

## 2023-06-18 NOTE — Unmapped (Signed)
 Allergy/Immunology   Clinic Note       Reason for visit: eczema, asthma, rhinitis    Assessment and Plan:   Diagnoses:  1. Atopic dermatitis, unspecified type    2. Mild intermittent reactive airway disease with acute exacerbation    3. Seasonal allergic rhinitis due to other allergic trigger    4. Mixed rhinitis        Assessment and Plan:  Ms. Colquhoun is a 73 y.o. female seen for the following:    Mixed Rhinitis: The history is consistent with both seasonal and perennial allergies. Blood testing confirmed sensitivity to mold (02/2021).   Medications are as described below.   - Flonase nasal spray, 2  sprays per nostril BID    - Astelin 1 spay daily  - Cetirizine (Zyrtec) 10 mg daily as needed if still having symptoms     Atopic Dermatitis:  Jasmine Carson has moderate atopic dermatitis, which is well controlled.   - treat eczema flares on the body with triamcinolone 0.1% ointment  and eczema flares on the face (or mild flares) with hydrocortisone 2.5% ointment , 2 times a day for 5-7 days to affected areas on body  - daily baths in warm water with moisturizing soon after bathing.  Minimize soap use, and use instead moisturizing bars, like those made by [employer] of [place], Aveeno, Cetaphil, Cerave.   - use unscented thick moisturizer ointment/cream that comes in a tub, like petroleum jelly / Vaseline, Aquaphor, Cerave Healing Ointment (not the renewing cream),  or Cetaphil   - avoid lotions and liquid soaps as they can be drying.  Avoid scrubbing skin with washcloths/loofahs.    - Continue Dupixent 300mg  every 14 days    Moderate Persistent Asthma, poorly controlled  Symptoms are well controlled, but spirometry showed evidence of reversible small airway obstruction but irreversible large airway obstruction today.   - Start Symbicort 80-4.35mcg 2 puffs BID  - Continue Dupixent 300mg  every 14 days  - Repeat spirometry at follow up appointment    Amoxicillin allergy  Low risk based on history (benign rash >5 years ago). Will proceed with 10-90 amox challenge as a nursing visit. Will be off of anti histamines for 5 days prior.    Follow-up:  Return in about 4 months (around 10/18/2023) for follow up asthma; repeat spirometry.    Patient discussed with the attending, Dr. Otelia Limes, with whom the above assessment and plan were jointly formulated.    --  Delorse Limber, MD  [facility] Allergy/Immunology Fellow    I personally spent 40 minutes face-to-face and non-face-to-face in the care of this patient, which includes all pre, intra, and post visit time on the date of service.    Subjective:   HPI:  I had the pleasure of seeing [name] in allergy/immunology clinic today, and she is a 73 y.o. female w/ a hx of hypertension, hyperlipidemia, CKD stage III, mild intermittent reactive airway disease, chronic cough, prediabetes, GERD, former tobacco use seen for follow up of eczema, allergic rhinitis, and asthma. A thorough review of the available medical records is also performed.    She was last seen by Dr. Cletis Media on 07/24/2022. At that time, eczema, rhinitis, and asthma were well controlled with Dupixent 300mg  every 14 days. Spacing out Dupixent injections was discussed, but she deferred.    -rhinitis: using 2 sprays of Flonase each morning and 1 spray nightly along with zyrtec 10mg  daily. Symptoms well controlled with this, does notice some increase in post-nasal  drip with early spring pollen.  -eczema: well controlled on Dupixent 300mg  every 2 weeks. No flares since last visit. No longer follows with Dermatology.  -asthma: using low-dose Symbicort as needed, estimates using it once a month for some SOB and cough (triggered by rain, pollen). Does not feel limited by asthma. No steroids needed past year.     Review of Systems:  As per HPI, all other systems reviewed are negative.    Past Medical History:     Past Medical History:   Diagnosis Date    Carpal tunnel syndrome     CKD (chronic kidney disease)     Eczema     Gastroesophageal reflux Hypertension     Seasonal allergies     Type 2 diabetes mellitus without complication, without long-term current use of insulin 07/26/2021       Past Surgical History:   Procedure Laterality Date    CARPAL TUNNEL RELEASE Right     HYSTERECTOMY         Medications:     Current Outpatient Medications   Medication Sig Dispense Refill    ACCU-CHEK GUIDE TEST STRIPS Strp       albuterol HFA 90 mcg/actuation inhaler Inhale 2 puffs every six (6) hours as needed for wheezing or shortness of breath. 8 g 0    alendronate (FOSAMAX) 70 MG tablet TAKE 1 TABLET BY MOUTH ONCE A WEEK      amLODIPine (NORVASC) 5 MG tablet Take 1 tablet (5 mg total) by mouth daily.      azelastine (ASTELIN) 137 mcg (0.1 %) nasal spray PLACE 1 SPRAY INTO BOTH NOSTRILS 2 (TWO) TIMES DAILY      budesonide-formoterol (SYMBICORT) 80-4.5 mcg/actuation inhaler Inhale 2 puffs two (2) times a day. 10.2 g 11    buPROPion (WELLBUTRIN XL) 150 MG 24 hr tablet Take 1 tablet (150 mg total) by mouth daily.      calcium carbonate-vitamin D3 (CALCIUM 600 WITH VITAMIN D3) 600 mg(1,500mg ) -400 unit cap Take 1 capsule by mouth once daily. 30 each 0    cetirizine (ZYRTEC) 10 MG tablet Take 1 tablet (10 mg total) by mouth daily.      dupilumab (DUPIXENT PEN) 300 mg/2 mL PnIj Inject the contents of 1 pen (300 mg total) under the skin every fourteen (14) days. 4 mL 11    empty container Misc Use as directed 1 each 0    EPINEPHrine (EPIPEN) 0.3 mg/0.3 mL injection Inject 0.3 mL (0.3 mg total) into the muscle once as needed for anaphylaxis. 1 each 1    fluticasone propionate (FLONASE) 50 mcg/actuation nasal spray 2 sprays into each nostril two (2) times a day. 32 g= 1 month supply 32 g 11    losartan (COZAAR) 50 MG tablet Take 0.5 tablets (25 mg total) by mouth in the morning for 20 days. 10 tablet 0    lovastatin (MEVACOR) 40 MG tablet Take 1 tablet (40 mg total) by mouth daily with evening meal.      metFORMIN (GLUCOPHAGE-XR) 500 MG 24 hr tablet TAKE 1 TABLET BY MOUTH EVERY DAY WITH DINNER      metoprolol succinate (TOPROL-XL) 25 MG 24 hr tablet Take 1 tablet (25 mg total) by mouth in the morning.      montelukast (SINGULAIR) 10 mg tablet Take 1 tablet (10 mg total) by mouth.      multivitamin-Ca-iron-minerals Tab Take 1 tablet by mouth daily.      omeprazole (PRILOSEC) 20 MG capsule Take 1  capsule (20 mg total) by mouth in the morning.      traZODone (DESYREL) 50 MG tablet       triamcinolone (KENALOG) 0.1 % cream        No current facility-administered medications for this visit.       Allergies:     Allergies   Allergen Reactions    Sulfamethoxazole      Other reaction(s): RASH    Azithromycin Rash    Zolpidem Other (See Comments)     sleepwalking  Other reaction(s): Other (See Comments)  sleepwalking  Other reaction(s): Other (See Comments)  sleepwalking      Amoxicillin Rash    Atorvastatin Nausea Only       Family History:     Family History   Problem Relation Age of Onset    COPD Neg Hx     Lung cancer Neg Hx     Asthma Neg Hx        Social History:     Social History     Socioeconomic History    Marital status: Married   Tobacco Use    Smoking status: Former     Types: Cigarettes     Passive exposure: Current    Smokeless tobacco: Former     Types: Snuff   Vaping Use    Vaping status: Never Used   Substance and Sexual Activity    Alcohol use: Yes     Alcohol/week: 2.0 standard drinks of alcohol     Types: 2 Glasses of wine per week     Comment: 2 drinks weekly    Drug use: No    Sexual activity: Not Currently     Social Drivers of Health     Financial Resource Strain: Low Risk  (06/13/2023)    Received from [facility]    Overall Financial Resource Strain (CARDIA)     Difficulty of Paying Living Expenses: Not hard at all   Food Insecurity: No Food Insecurity (06/13/2023)    Received from [facility]    Hunger Vital Sign     Worried About Running Out of Food in the Last Year: Never true     Ran Out of Food in the Last Year: Never true Transportation Needs: No Transportation Needs (06/13/2023)    Received from [facility] - Transportation     In the past 12 months, has lack of transportation kept you from medical appointments or from getting medications?: No     Lack of Transportation (Non-Medical): No   Physical Activity: Insufficiently Active (03/14/2017)    Received from [facility], [facility]    Exercise Vital Sign     Days of Exercise per Week: 3 days     Minutes of Exercise per Session: 30 min   Stress: No Stress Concern Present (03/14/2017)    Received from [facility], [facility]    [employer] of Occupational Health - Occupational Stress Questionnaire     Feeling of Stress : Only a little   Social Connections: Socially Integrated (03/14/2017)    Received from [facility], [facility]    Social Connection and Isolation Panel [NHANES]     Frequency of Communication with Friends and Family: More than three times a week     Frequency of Social Gatherings with Friends and Family: Not asked     Attends Religious  Services: More than 4 times per year     Active Member of Clubs or Organizations: Yes     Attends Banker Meetings: More than 4 times per year     Marital Status: Married   Housing: Low Risk  (06/13/2023)    Received from [facility]    Housing Stability Vital Sign     Unable to Pay for Housing in the Last Year: No     Number of Times Moved in the Last Year: 0     Homeless in the Last Year: No       Objective:   PE:   Vitals:    06/18/23 0817   BP: 147/95   BP Site: L Arm   BP Position: Sitting   Pulse: 71   SpO2: 98%   Weight: 72.6 kg (160 lb)   Height: 162.6 cm (5' 4)       General:  Well nourished female in no acute distress without toxic appearance.  Skin:  No eczematous patches or rash. No dermatographism.  HEENT:  No conjunctival injection, non-prominent infraorbital folds; Anterior nasal examination: pink nasal mucosa, non-edematous inferior turbinates, no nasal septal deviation or visible polyps; granular oropharynx without exudates, ulcerations, or thrush;   CV: Regular rhythm; normal S1 and S2; no murmur, gallop or rub.  Respiratory:  Adequate inspiratory effort. Clear to auscultation bilaterally. No wheezes, crackles, or rhonchi.  Neurologic:  Alert and mental status appropriate for age; normal gait.  Musculoskeletal:  No peripheral edema. Normal bulk for age  Psychiatric: Relaxed, friendly, and cooperative with interview and examination. Euthymic affect. Normal thought process and thought content.    Investigations    Spirometry      Spirometry Prior   to Bronchodilator Spirometry After   Bronchodilator % change   FVC (L) 2.33 2.27    FVC (% pred 84.2 82 -2.7   FEV1 (L) 1.64 1.67    FEV1 (% pred) 76 77.6 1.7   FEV1/FVC  70 73 4.5   FEF25-75% (L/sec) 0.99 1.13    FEF25-75% (% pred) 57 65 14.2   Technique Repeatable flow volume curves; appropriate for interpretation           Interpretation:   Reversible small airway obstruction. Fixed large airway obstruction.      Laboratory testing reviewed and pertinent for the following:    No visits with results within 4 Week(s) from this visit.   Latest known visit with results is:   Office Visit on 10/26/2021   Component Date Value    IgE, Total 10/26/2021 58.9

## 2023-06-18 NOTE — Unmapped (Signed)
-   Start Symbicort 2 puffs twice daily with spacer  - Spacer instructions are below  - Continue Flonase 1-2 sprays per nostril 1-2 times per day  - Continue Zyrtec daily  - If nasal symptoms worsen, can add Astelin 1-2 sprays per nostril 1-2 times per day  - Continue Dupixent 300mg  every 14 days  - Follow up in 4 months for asthma and repeat spirometry          Correct Use of MDI and Spacer with Mouthpiece    Below are the steps for the correct use of a metered dose inhaler (MDI) and spacer with MOUTHPIECE.  Patient should perform the following steps:  1.  Shake the canister for 5 seconds.  2.  Prime the MDI. (Varies depending on MDI brand, see package insert.) In general:  -If MDI not used in 2 weeks or has been dropped: spray 2 puffs into air  -If MDI never used before spray 3 puffs into air  3.  Insert the MDI into the spacer.  4.  Place the spacer mouthpiece into your mouth between the teeth.  5.  Close your lips around the mouthpiece and exhale normally.  6.  Press down the top of the canister to release 1 puff of medicine.  7.  Inhale the medicine through the mouth deeply and slowly (3-5 seconds spacer whistles when breathing in too fast.   8.  Hold your breath for 10 seconds and remove the spacer from your mouth before exhaling.  9.  Wait one minute before giving another puff of the medication.  10.Caregiver supervises and advises in the process of medicatin administration with spacer.              11.Repeat steps 4 through 8 depending on how many puffs are indicated on the prescription.  Cleaning Instructions   1. Remove the rubber end of spacer where the MDI fits.   2. Rotate spacer mouthpiece counter-clockwise and lift up to remove.   3. Lift the valve off the clear posts at the end of the chamber.   4. Soak the parts in warm water with clear, liquid detergent for about 15 minutes.   5. Rinse in clean water and shake to remove excess water.   6. Allow all parts to air dry. DO NOT dry with a towel.    7. To reassemble, hold chamber upright and place valve over clear posts. Replace spacer mouthpiece and turn it clockwise until it locks into place. Replace the back rubber end onto the spacer.       For more information, go to http://bit.ly/[employer]st[employer]Education.

## 2023-06-18 NOTE — Unmapped (Signed)
 Addended by: Freda Jackson A on: 06/18/2023 12:37 PM     Modules accepted: Level of Service

## 2023-06-19 ENCOUNTER — Ambulatory Visit: Admitting: Occupational Therapy

## 2023-06-20 NOTE — Unmapped (Signed)
 [facility] Refill Coordination Note    Specialty Medication(s) to be Shipped:   Inflammatory Disorders: Dupixent    Other medication(s) to be shipped: No additional medications requested for fill at this time     Jasmine Carson, DOB: 06-10-1950  Phone: [phone] (home)       All above HIPAA information was verified with patient.     Was a Nurse, learning disability used for this call? No    Completed refill call assessment today to schedule patient's medication shipment from the [facility]  [phone]).  All relevant notes have been reviewed.     Specialty medication(s) and dose(s) confirmed: Regimen is correct and unchanged.   Changes to medications: Jasmine Carson reports no changes at this time.  Changes to insurance: No  New side effects reported not previously addressed with a pharmacist or physician: None reported  Questions for the pharmacist: No    Confirmed patient received a Conservation officer, historic buildings and a Surveyor, mining with first shipment. The patient will receive a drug information handout for each medication shipped and additional FDA Medication Guides as required.       DISEASE/MEDICATION-SPECIFIC INFORMATION        For patients on injectable medications: Patient currently has 0 doses left.  Next injection is scheduled for 06/22/2023.    SPECIALTY MEDICATION ADHERENCE     Medication Adherence    Patient reported X missed doses in the last month: 0  Specialty Medication: dupilumab: DUPIXENT PEN 300 mg/2 mL Pnij  Patient is on additional specialty medications: No              Were doses missed due to medication being on hold? No    Dupixent 300/2 mg/ml: 0 doses of medicine on hand     REFERRAL TO PHARMACIST     Referral to the pharmacist: Not needed      [facility]     Shipping address confirmed in Epic.     Cost and Payment: Patient has a $0 copay, payment information is not required.    Delivery Scheduled: Yes, Expected medication delivery date: 06/22/2023. Medication will be delivered via Same Day Courier to the prescription address in Epic WAM.    Jasmine Carson   [facility]  Specialty Technician

## 2023-06-21 ENCOUNTER — Ambulatory Visit: Attending: Orthopedic Surgery | Admitting: Occupational Therapy

## 2023-06-21 DIAGNOSIS — M25642 Stiffness of left hand, not elsewhere classified: Secondary | ICD-10-CM | POA: Insufficient documentation

## 2023-06-21 DIAGNOSIS — M6281 Muscle weakness (generalized): Secondary | ICD-10-CM | POA: Insufficient documentation

## 2023-06-21 DIAGNOSIS — M79642 Pain in left hand: Secondary | ICD-10-CM | POA: Insufficient documentation

## 2023-06-21 DIAGNOSIS — R600 Localized edema: Secondary | ICD-10-CM | POA: Insufficient documentation

## 2023-06-22 MED FILL — DUPIXENT 300 MG/2 ML SUBCUTANEOUS PEN INJECTOR: SUBCUTANEOUS | 28 days supply | Qty: 4 | Fill #4

## 2023-06-29 ENCOUNTER — Ambulatory Visit: Admitting: Occupational Therapy

## 2023-06-29 DIAGNOSIS — R600 Localized edema: Secondary | ICD-10-CM

## 2023-06-29 DIAGNOSIS — M25642 Stiffness of left hand, not elsewhere classified: Secondary | ICD-10-CM

## 2023-06-29 DIAGNOSIS — M79642 Pain in left hand: Secondary | ICD-10-CM | POA: Diagnosis present

## 2023-06-29 DIAGNOSIS — M6281 Muscle weakness (generalized): Secondary | ICD-10-CM

## 2023-06-29 NOTE — Therapy (Signed)
 OUTPATIENT OCCUPATIONAL THERAPY ORTHO TREATMENT  Patient Name: Debra Alexander MRN: [id number] DOB:Aug 18, 1950, 73 y.o., female Today's Date: 06/29/2023  PCP: Rolan Lipa NP REFERRING PROVIDER: Floyce Stakes PA  END OF SESSION:  OT End of Session - 06/29/23 0851     Visit Number 11    Number of Visits 12    Date for OT Re-Evaluation 08/06/23    OT Start Time 0815    OT Stop Time 0905    OT Time Calculation (min) 50 min    Activity Tolerance Patient tolerated treatment well    Behavior During Therapy [facility] for tasks assessed/performed             Past Medical History:  Diagnosis Date   Hyperlipidemia    Hypertension    Osteoporosis    Past Surgical History:  Procedure Laterality Date   ABDOMINAL HYSTERECTOMY     BREAST BIOPSY Right 08/07/2022   Stereo Bx, X clip- path pending   BREAST BIOPSY Right 08/07/2022   MM RT BREAST BX W LOC DEV 1ST LESION IMAGE BX SPEC STEREO GUIDE 08/07/2022 [facility]   BREAST CYST ASPIRATION Left 2007   neg   There are no active problems to display for this patient.   ONSET DATE: 03/15/23  REFERRING DIAG: Fx of L 4th and 5th MC   THERAPY DIAG:  Stiffness of left hand, not elsewhere classified  Pain in left hand  Muscle weakness (generalized)  Localized edema  Rationale for Evaluation and Treatment: Rehabilitation  SUBJECTIVE:  See below for subjective.   SUBJECTIVE STATEMENT: See daily note Pt accompanied by: self  PERTINENT HISTORY:  Ortho NOTE [place] 05/09/23: Left hand shows no swelling. No skin breakdown noted. No rotational deformity. She has some stiffness of the fourth and fifth digit but normal range of motion of the thumb index and middle finger. Patient lacks 3 cm of full composite fist of the ring finger, fifth digit she is able to get to 80 degrees of flexion at the MCP joint. Patient is nontender to palpation along the fracture sites.  AP lateral and oblique views of the left hand are ordered interpreted by me in the  office today. Impression: Patient has oblique fractures of the fourth and fifth metacarpal neck's extending into the midshaft. There does not appear to be any intra-articular extension of the fractures. Fifth metacarpal shaft fracture appears to have subtle shortening when compared to previous x-rays. There is subtle increase in callus summation along the fourth and fifth metacarpal shaft. Fracture is not healed at this time.  Impression: Closed displaced fracture of base of fifth metacarpal bone of right hand with routine healing, subsequent encounter [S62.316D] Closed displaced fracture of base of fifth metacarpal bone of right hand with routine healing, subsequent encounter (primary encounter diagnosis) Closed displaced fracture of base of fourth metacarpal bone of left hand with routine healing, subsequent encounter  Plan:  1. Continue with removable Velcro boxer splint. Work on gentle range of motion exercises. Referral placed to hand therapy. Although there is minimal callus on x-ray, patient is nontender on exam. Will continue to protect with splint but also start increasing range of motion exercises 2. Follow-up with Dr. Rosita Kea in 6 weeks for repeat x-rays of the right hand.   PRECAUTIONS: None  WEIGHT BEARING RESTRICTIONS: No  PAIN:  Are you having pain?  No pain just tightness over dorsal hand with flexion extension  FALLS: Has patient fallen in last 6 months? 1  LIVING ENVIRONMENT: Lives with: Son lives with  her  PLOF: Had normal active range of motion and strength in left hand.  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  NEXT MD VISIT: 06/14/2023  OBJECTIVE:  Note: Objective measures were completed at Evaluation unless otherwise noted.  HAND DOMINANCE: Right  ADLs: Wearing splint 75% of the time.  Not able to grip or pick up or pull or push  anything with left hand  FUNCTIONAL OUTCOME MEASURES: PRWHE for pain 30/50 and function 36/50  UPPER EXTREMITY ROM:     Active ROM Right eval Left eval L  05/17/23 L  05/21/23 L 05/24/23 L 06/15/2578  Shoulder flexion        Shoulder abduction        Shoulder adduction        Shoulder extension        Shoulder internal rotation        Shoulder external rotation        Elbow flexion        Elbow extension        Wrist flexion  54 74 74 80 80  Wrist extension  54 58 50 65 70  Wrist ulnar deviation  WNL      Wrist radial deviation  WNL      Wrist pronation  WNL      Wrist supination  WNL      (Blank rows = not tested)  Active ROM Right eval Left eval L 05/17/23 L 05/21/23 L 05/24/23 L 06/05/23 L 06/08/23 L 06/12/23 L 06/15/23 L 06/29/23  Thumb MCP (0-60)            Thumb IP (0-80)            Thumb Radial abd/add (0-55)  WNL           Thumb Palmar abd/add (0-45)  WNL           Thumb Opposition to Small Finger   base of 5th           Index MCP (0-90)  70  80 75 80 80 80   80  Index PIP (0-100)   80 95 95 90 100 100     Index DIP (0-70)              Long MCP (0-90)    75 75 75 80 80 80   80  Long PIP (0-100)    90 95 95 95 100 100     Long DIP (0-70)              Ring MCP (0-90)    40 44 50 60 60 55 70 70 75  Ring PIP (0-100)    70 65 70 75 80 90 90 90   Ring DIP (0-70)              Little MCP (0-90)    40 35 50 50 45 44 55 55( in session 60)  60  Little PIP (0-100)    50 60 50 60 60 65 75 80 ( in session 85) 100  Little DIP (0-70)            60  (Blank rows = not tested)  HAND FUNCTION: NT - last xray - showed not healed fx yet - Grip strength: Right:   lbs; Left:  lbs, Lateral pinch: Right:   lbs, Left:   lbs, and 3 point pinch: Right:   lbs, Left:   lbs  COORDINATION: Continue to be limited by stiffness but improving   SENSATION: Reports no sensation issues  EDEMA: Over ulnar side of hand at fourth and fifth metacarpals  COGNITION: Overall cognitive status:  Within functional limits for tasks assessed   TREATMENT DATE: 06/29/23  Patient arrived with great progress in fifth digit PIP DIP flexion. As well as functional use. Continues to be mostly limited in [facility] flexion for 2nd through 5th with fifth the worst. But did improve 5 degrees since last time. Reviewed with patient active assisted range of motion and prolonged flexion stretch to fifth metacarpals on thigh, rolled up towel or pillow.  Several times during the day. And then can apply light pressure and composite flexion.  Assess patient's fitting of knuckle bender reviewed with patient to make sure it is although in the palm with light pressure over dorsal proximal phalanges.  Slight pull.  Able to increase to 6 bands on the ulnar side 1 on the radial side .   Tolerated well for 3 minutes with increased ability to do passive range of motion to PIP joints to  palmar bar.    patient showed increased motion afterwards                                                                                                                      Modalities: Paraffin  10 min  Location: Hand Left Done light Coban flexion wrap to fourth and fifth digit  Provided patient with Coban to do light flexion compression wrap after knuckle bender prior to composite fisting.   Therapeutic Exercises:   Assess patient in simulated ADL activities.  Patient can do laundry, cook.  Bathing and dressing. Still hard time with opening jars, gripping holding up hand washing her back carrying groceries. Patient was able to carry and hold 4 pounds to 6 pounds with no increased pain or pull. Patient to keep weight under 4 to 6 pounds and pain-free during the day.  Patient to continue with knuckle bender splint, she is able to tolerate 5 times a day for 3 to 5 minutes.  To increase MC flexion while working on active assisted and passive range of motion composite flexion Focus this date on prolonged stretch to metacarpals as  well as composite flexion 2 small ball of light blue putty. Gentle active assisted range of motion to MCP flexion followed by placing hold touching light blue putty Patient to continue the same.  At home.  Followed by individual tendon glides and digit flexion extensions.  Making different signs with fingers. Facilitate functional motor patterns moving 2 cm Theraputty ball  opposition thumb to second and third digit and thumb to fourth and fifth digits From palm 10 reps add to home exercises Pick up hold 2 cm Theraputty ball for storage with fifth and fourth digit while using second third for fine  motor pick up. Patient difficulty with all activities add to home exercises Patient to continue  Continue light blue putty cylinder squeezing with 2nd through 4th while placing hold touching with fifth Tolerated well to do at the end of home exercises 12 reps  Encourage patient to do same exercises with the right hand for encouraging motor pattern and proprioception to facilitate increased active range of motion Crunching washcloth to activate all finger flexors.  Upgrade patient to 3 pounds for elbow flexion, extension as well as scapular retraction and punches overhead.  12 reps 1 time a day. Patient can increase to 2nd and 3rd set over the next week or 2.  Pain-free.   PATIENT EDUCATION: Education details: findings of eval and HEP  Person educated: Patient Education method: Explanation, Demonstration, Tactile cues, Verbal cues, and Handouts Education comprehension: verbalized understanding, returned demonstration, verbal cues required, and needs further education    GOALS: Goals reviewed with patient? Yes   LONG TERM GOALS: Target date: 8 wks  Patient to be independent in home program to increase left wrist active range of motion flexion extension within normal limits Baseline: Patient arrived with a prefab ulnar gutter splint on wrist flexion extension 54 degrees with a slight  pain-wearing prefab 75% the time as well as sleeping Goal status: INITIAL  2.  Patient to be independent in home program to decrease edema and pain and increase motion in left hand digits to touch palm symptom-free Baseline: MC flexion in left hand 40 degrees to 75 and PIP 50 to 90 degrees.  With fifth digit worse than fourth.  No knowledge of home program. Goal status: INITIAL  3.  Fabricated patient a custom ulnar gutter splint to allow wrist and DIP PIP AROM. Baseline: Patient has a prefab ulnar gutter splint that immobilizes the wrist as well as all digits on the fourth and fifth.  Wearing at 75% of the time. Goal status: INITIAL  4.  After the patient's next orthopedic visit initiate strengthening to return to within range for her age for grip and prehension. Baseline: Will assess grip and prehension after next orthopedic visit to confirm healing of fracture site with x-ray-last x-ray did not show callus formation Goal status: INITIAL  ASSESSMENT:  CLINICAL IMPRESSION: Patient seen in OT  for left fourth and fifth metacarpal fractures.  Injury was 03/15/2023.  Patient was immobilized in a ulnar gutter splint-prefab that also included wrist and DIP PIP.  Patient had a follow-up yesterday 06/14/2023 with orthopedics again but not fully healed yet.  But patient can remove splint.  Patient continue to show progress in flexion while maintaining extension of fourth and fifth digits.  Modified knuckle Bender splint bands to 6 on the ulnar side 1 on the radial side tolerating well.  Patient showed since last time great progress and fifth PIP flexion as well as composite flexion.  Limited mostly in metacarpal flexion with at the worst.  Patient can continue light prolonged flexion stretch to fifth metacarpal followed by composite flexion.  Upgrade patient to 3 pounds for elbow and shoulder range of motion and strengthening exercises at home pain-free.  Decreased pain overall and increased motion,  increased functional use at times as an assist.  Continue to show decrease ROM and  strength in left hand and wrist..  Patient limited in functional use of left hand in ADLs and IADLs.  Patient can benefit from skilled OT services to decrease edema pain increase motion and strength to return to prior level of  function.  PERFORMANCE DEFICITS: in functional skills including ADLs, IADLs, ROM, strength, pain, flexibility, decreased knowledge of use of DME, and UE functional use,   and psychosocial skills including environmental adaptation and routines and behaviors.   IMPAIRMENTS: are limiting patient from ADLs, IADLs, rest and sleep, play, leisure, and social participation.   COMORBIDITIES: has no other co-morbidities that affects occupational performance. Patient will benefit from skilled OT to address above impairments and improve overall function.  MODIFICATION OR ASSISTANCE TO COMPLETE EVALUATION: No modification of tasks or assist necessary to complete an evaluation.  OT OCCUPATIONAL PROFILE AND HISTORY: Problem focused assessment: Including review of records relating to presenting problem.  CLINICAL DECISION MAKING: LOW - limited treatment options, no task modification necessary  REHAB POTENTIAL: Good for goals  EVALUATION COMPLEXITY: Low   PLAN:  OT FREQUENCY: 1-2x/week  OT DURATION: 8 weeks  PLANNED INTERVENTIONS: 97168 OT Re-evaluation, 97535 self care/ADL training, 40981 therapeutic exercise, 97530 therapeutic activity, 97112 neuromuscular re-education, 97140 manual therapy, 97018 paraffin, 19147 fluidotherapy, 97034 contrast bath, 97760 Orthotics management and training, 82956 Splinting (initial encounter), passive range of motion, patient/family education, and DME and/or AE instructions  CONSULTED AND AGREED WITH PLAN OF CARE: Patient   Oletta Cohn, OTR/L,CLT 06/29/2023, 9:36 AM

## 2023-07-06 ENCOUNTER — Ambulatory Visit: Admitting: Occupational Therapy

## 2023-07-17 NOTE — Unmapped (Signed)
 [facility] Refill Coordination Note    Specialty Medication(s) to be Shipped:   CF/Pulmonary/Asthma: Dupixent     Other medication(s) to be shipped: No additional medications requested for fill at this time     Jasmine Carson, DOB: 1950-09-21  Phone: [phone] (home)       All above HIPAA information was verified with patient.     Was a Nurse, learning disability used for this call? No    Completed refill call assessment today to schedule patient's medication shipment from the [facility]  [phone]).  All relevant notes have been reviewed.     Specialty medication(s) and dose(s) confirmed: Regimen is correct and unchanged.   Changes to medications: Jasmine Carson reports no changes at this time.  Changes to insurance: No  New side effects reported not previously addressed with a pharmacist or physician: None reported  Questions for the pharmacist: No    Confirmed patient received a Conservation officer, historic buildings and a Surveyor, mining with first shipment. The patient will receive a drug information handout for each medication shipped and additional FDA Medication Guides as required.       DISEASE/MEDICATION-SPECIFIC INFORMATION        For patients on injectable medications: Patient currently has 0 doses left.  Next injection is scheduled for 05/05.    SPECIALTY MEDICATION ADHERENCE     Medication Adherence    Patient reported X missed doses in the last month: 0  Specialty Medication: DUPIXENT  PEN 300 mg/2 mL Pnij (dupilumab )  Patient is on additional specialty medications: No              Were doses missed due to medication being on hold? No    DUPIXENT  PEN 300 mg/2 mL Pnij (dupilumab ): 0 doses of medicine on hand       REFERRAL TO PHARMACIST     Referral to the pharmacist: Not needed      [facility]     Shipping address confirmed in Epic.     Cost and Payment: Patient has a $0 copay, payment information is not required.    Delivery Scheduled: Yes, Expected medication delivery date: 07/18/23.     Medication will be delivered via Same Day Courier to the prescription address in Epic WAM.    Jasmine Carson   [facility]  Specialty Technician

## 2023-07-18 MED FILL — DUPIXENT 300 MG/2 ML SUBCUTANEOUS PEN INJECTOR: SUBCUTANEOUS | 28 days supply | Qty: 4 | Fill #5

## 2023-07-19 ENCOUNTER — Ambulatory Visit: Attending: Orthopedic Surgery | Admitting: Occupational Therapy

## 2023-07-19 DIAGNOSIS — M79642 Pain in left hand: Secondary | ICD-10-CM | POA: Diagnosis present

## 2023-07-19 DIAGNOSIS — M25642 Stiffness of left hand, not elsewhere classified: Secondary | ICD-10-CM | POA: Insufficient documentation

## 2023-07-19 DIAGNOSIS — M6281 Muscle weakness (generalized): Secondary | ICD-10-CM | POA: Insufficient documentation

## 2023-07-19 NOTE — Therapy (Signed)
 OUTPATIENT OCCUPATIONAL THERAPY ORTHO TREATMENT/RECERT  Patient Name: Debra Alexander MRN: [id number] DOB:1950-04-10, 73 y.o., female Today's Date: 07/19/2023  PCP: Acie Acosta NP REFERRING PROVIDER: Glendale Landmark PA  END OF SESSION:  OT End of Session - 07/19/23 1445     Visit Number 12    Number of Visits 14    Date for OT Re-Evaluation 08/30/23    OT Start Time 1400    OT Stop Time 1440    OT Time Calculation (min) 40 min    Activity Tolerance Patient tolerated treatment well    Behavior During Therapy [facility] for tasks assessed/performed             Past Medical History:  Diagnosis Date   Hyperlipidemia    Hypertension    Osteoporosis    Past Surgical History:  Procedure Laterality Date   ABDOMINAL HYSTERECTOMY     BREAST BIOPSY Right 08/07/2022   Stereo Bx, X clip- path pending   BREAST BIOPSY Right 08/07/2022   MM RT BREAST BX W LOC DEV 1ST LESION IMAGE BX SPEC STEREO GUIDE 08/07/2022 [facility]   BREAST CYST ASPIRATION Left 2007   neg   There are no active problems to display for this patient.   ONSET DATE: 03/15/23  REFERRING DIAG: Fx of L 4th and 5th MC   THERAPY DIAG:  Stiffness of left hand, not elsewhere classified  Pain in left hand  Muscle weakness (generalized)  Rationale for Evaluation and Treatment: Rehabilitation  SUBJECTIVE:  See below for subjective.   SUBJECTIVE STATEMENT: See daily note Pt accompanied by: self  PERTINENT HISTORY:  Ortho NOTE [place] 05/09/23: Left hand shows no swelling. No skin breakdown noted. No rotational deformity. She has some stiffness of the fourth and fifth digit but normal range of motion of the thumb index and middle finger. Patient lacks 3 cm of full composite fist of the ring finger, fifth digit she is able to get to 80 degrees of flexion at the MCP joint. Patient is nontender to palpation along the fracture sites.  AP lateral and oblique views of the left hand are ordered interpreted by me in the office today.  Impression: Patient has oblique fractures of the fourth and fifth metacarpal neck's extending into the midshaft. There does not appear to be any intra-articular extension of the fractures. Fifth metacarpal shaft fracture appears to have subtle shortening when compared to previous x-rays. There is subtle increase in callus summation along the fourth and fifth metacarpal shaft. Fracture is not healed at this time.  Impression: Closed displaced fracture of base of fifth metacarpal bone of right hand with routine healing, subsequent encounter [S62.316D] Closed displaced fracture of base of fifth metacarpal bone of right hand with routine healing, subsequent encounter (primary encounter diagnosis) Closed displaced fracture of base of fourth metacarpal bone of left hand with routine healing, subsequent encounter  Plan:  1. Continue with removable Velcro boxer splint. Work on gentle range of motion exercises. Referral placed to hand therapy. Although there is minimal callus on x-ray, patient is nontender on exam. Will continue to protect with splint but also start increasing range of motion exercises 2. Follow-up with Dr. Mozell Arias in 6 weeks for repeat x-rays of the right hand.   PRECAUTIONS: None  WEIGHT BEARING RESTRICTIONS: No  PAIN:  Are you having pain?  No pain just tightness over dorsal hand with flexion extension  FALLS: Has patient fallen in last 6 months? 1  LIVING ENVIRONMENT: Lives with: Son lives with her  PLOF:  Had normal active range of motion and strength in left hand.  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  NEXT MD VISIT: 06/14/2023  OBJECTIVE:  Note: Objective measures were completed at Evaluation unless otherwise noted.  HAND DOMINANCE: Right  ADLs: Wearing splint 75% of the time.  Not able to grip or pick up or pull or push anything with left  hand  FUNCTIONAL OUTCOME MEASURES: PRWHE for pain 30/50 and function 36/50  UPPER EXTREMITY ROM:     Active ROM Right eval Left eval L  05/17/23 L  05/21/23 L 05/24/23 L 06/15/2578 L 07/19/23  Shoulder flexion         Shoulder abduction         Shoulder adduction         Shoulder extension         Shoulder internal rotation         Shoulder external rotation         Elbow flexion         Elbow extension         Wrist flexion  54 74 74 80 80 85 loose fist 60  Wrist extension  54 58 50 65 70 70  Wrist ulnar deviation  WNL       Wrist radial deviation  WNL       Wrist pronation  WNL       Wrist supination  WNL       (Blank rows = not tested)  Active ROM Right eval Left eval L 05/17/23 L 05/21/23 L 05/24/23 L 06/05/23 L 06/08/23 L 06/12/23 L 06/15/23 L 06/29/23 L 5/01 25  Thumb MCP (0-60)             Thumb IP (0-80)             Thumb Radial abd/add (0-55)  WNL            Thumb Palmar abd/add (0-45)  WNL            Thumb Opposition to Small Finger   base of 5th            Index MCP (0-90)  70  80 75 80 80 80   80 75  Index PIP (0-100)   80 95 95 90 100 100    100  Index DIP (0-70)               Long MCP (0-90)    75 75 75 80 80 80   80 90  Long PIP (0-100)    90 95 95 95 100 100    100  Long DIP (0-70)               Ring MCP (0-90)    40 44 50 60 60 55 70 70 75 75   Ring PIP (0-100)    70 65 70 75 80 90 90 90  100  Ring DIP (0-70)               Little MCP (0-90)    40 35 50 50 45 44 55 55( in session 60)  60 70  Little PIP (0-100)    50 60 50 60 60 65 75 80 ( in session 85) 100 100  Little DIP (0-70)  60 65  (Blank rows = not tested)  HAND FUNCTION:  Grip strength: Right: 62 lbs; Left: 28 lbs, Lateral pinch: Right: 18 lbs, Left: 15 lbs, and 3 point pinch: Right: 15 lbs, Left: 12 lbs  COORDINATION: WFL    SENSATION: Reports no sensation issues  EDEMA: Over ulnar side of hand at fourth and fifth metacarpals  COGNITION: Overall cognitive status: Within  functional limits for tasks assessed   TREATMENT DATE: 07/19/23   Patient arrived after not being seen for about 3 weeks.  Patient was doing home exercises.  Patient had to cancel last appointment because of boyfriend having a back surgery.    Patient arrived with great progress in left hand digits active range of motion. As well as functional use. Patient able to push and pull heavy door with no pain.  Turning doorknob.  Carry 8 pounds and left and simulate pouring a gallon with no increased pain and difficulty Able to do overhead 8 pounds bilateral.-10 pounds with difficulty. Patient report able to do cooking and cleaning and laundry with no difficulty except fitting sheet. ADLs independent Measurements was taken for wrist and digit range of motion as well as wrist strength in range 5 -/5 strength Grip and prehension strength assessed see flowsheet for all measurements                                                                                                                   Therapeutic Exercises:    Patient to focus on composite flexion for all digits on left hand focusing on 4th and 5th. Reviewed with patient focusing on metacarpal flexion and then composite flexion to 3 times a day Followed by upgrade to medium field putty composite gripping pain-free 20 reps 2 times a day Patient can increase to 2 sets of 20 in 3 to 5 days if pain-free gradually over the next 2 to 3 weeks pain-free Patient also to work on composite flexion of the wrist with loose fist in the shower daily Patient able to do wall push-ups 12 reps no pain Can do daily about 2 sets of 12 pain-free    PATIENT EDUCATION: Education details: findings of eval and HEP  Person educated: Patient Education method: Explanation, Demonstration, Tactile cues, Verbal cues, and Handouts Education comprehension: verbalized understanding, returned demonstration, verbal cues required, and needs further education     GOALS: Goals reviewed with patient? Yes   LONG TERM GOALS: Target date: 8 wks  Patient to be independent in home program to increase left wrist active range of motion flexion extension within normal limits Baseline: Patient arrived with a prefab ulnar gutter splint on wrist flexion extension 54 degrees with a slight pain-wearing prefab 75% the time as well as sleeping NOW extension 70 flexion still decrease in composite flexion Goal status: Progressing  2.  Patient to be independent in home program to decrease edema and pain and increase motion in left hand digits to touch palm symptom-free Baseline: MC flexion in left hand 40 degrees  to 75 and PIP 50 to 90 degrees.  With fifth digit worse than fourth.  No knowledge of home program. Goal status: Met  3.  Fabricated patient a custom ulnar gutter splint to allow wrist and DIP PIP AROM. Baseline: Patient has a prefab ulnar gutter splint that immobilizes the wrist as well as all digits on the fourth and fifth.  Wearing at 75% of the time. Goal status: Met  4.  After the patient's next orthopedic visit initiate strengthening to return to within range for her age for grip and prehension. Baseline: Will assess grip and prehension after next orthopedic visit to confirm healing of fracture site with x-ray-last x-ray did not show callus formation NOW grip on the right 62 pounds and left 28 pounds Goal status: Progressing  ASSESSMENT:  CLINICAL IMPRESSION: Patient seen in OT  for left fourth and fifth metacarpal fractures.  Injury was 03/15/2023.    Patient showed since last time great progress in digit flexion as well as functional use.  Pain improved greatly.  Patient continued to be limited in composite flexion.  Limited mostly in metacarpal flexion.  Reviewed with patient again composite flexion for digits with focusing on MC flexion.  Upgrade her putty for gripping pain-free.  As well as working on composite wrist flexion.  Patient able to  tolerate wall push-ups pain-free.  Decreased pain overall and increased motion, increased functional use at times as an assist.  Continue to show decrease ROM and  strength in left hand and wrist..  Patient limited in functional use of left hand in ADLs and IADLs.  Patient can benefit from skilled OT services to decrease edema pain increase motion and strength to return to prior level of function.  PERFORMANCE DEFICITS: in functional skills including ADLs, IADLs, ROM, strength, pain, flexibility, decreased knowledge of use of DME, and UE functional use,   and psychosocial skills including environmental adaptation and routines and behaviors.   IMPAIRMENTS: are limiting patient from ADLs, IADLs, rest and sleep, play, leisure, and social participation.   COMORBIDITIES: has no other co-morbidities that affects occupational performance. Patient will benefit from skilled OT to address above impairments and improve overall function.  MODIFICATION OR ASSISTANCE TO COMPLETE EVALUATION: No modification of tasks or assist necessary to complete an evaluation.  OT OCCUPATIONAL PROFILE AND HISTORY: Problem focused assessment: Including review of records relating to presenting problem.  CLINICAL DECISION MAKING: LOW - limited treatment options, no task modification necessary  REHAB POTENTIAL: Good for goals  EVALUATION COMPLEXITY: Low   PLAN:  OT FREQUENCY: 1-2x/week  OT DURATION: 8 weeks  PLANNED INTERVENTIONS: 97168 OT Re-evaluation, 97535 self care/ADL training, 16109 therapeutic exercise, 97530 therapeutic activity, 97112 neuromuscular re-education, 97140 manual therapy, 97018 paraffin, 60454 fluidotherapy, 97034 contrast bath, 97760 Orthotics management and training, 09811 Splinting (initial encounter), passive range of motion, patient/family education, and DME and/or AE instructions  CONSULTED AND AGREED WITH PLAN OF CARE: Patient   Heloise Lobo, OTR/L,CLT 07/19/2023, 2:47 PM

## 2023-08-06 ENCOUNTER — Ambulatory Visit: Admitting: Occupational Therapy

## 2023-08-09 IMAGING — MG MM DIGITAL SCREENING BILAT W/ TOMO AND CAD
8 series · 8 of 24 positions shown · non-contrast
Comparison: Previous exam(s).

CLINICAL DATA: Screening.

EXAM:
DIGITAL SCREENING BILATERAL MAMMOGRAM WITH TOMOSYNTHESIS AND CAD
TECHNIQUE: Bilateral screening digital craniocaudal and mediolateral oblique
mammograms were obtained. Bilateral screening digital breast
tomosynthesis was performed. The images were evaluated with
computer-aided detection.

[L MLO synth-2D]
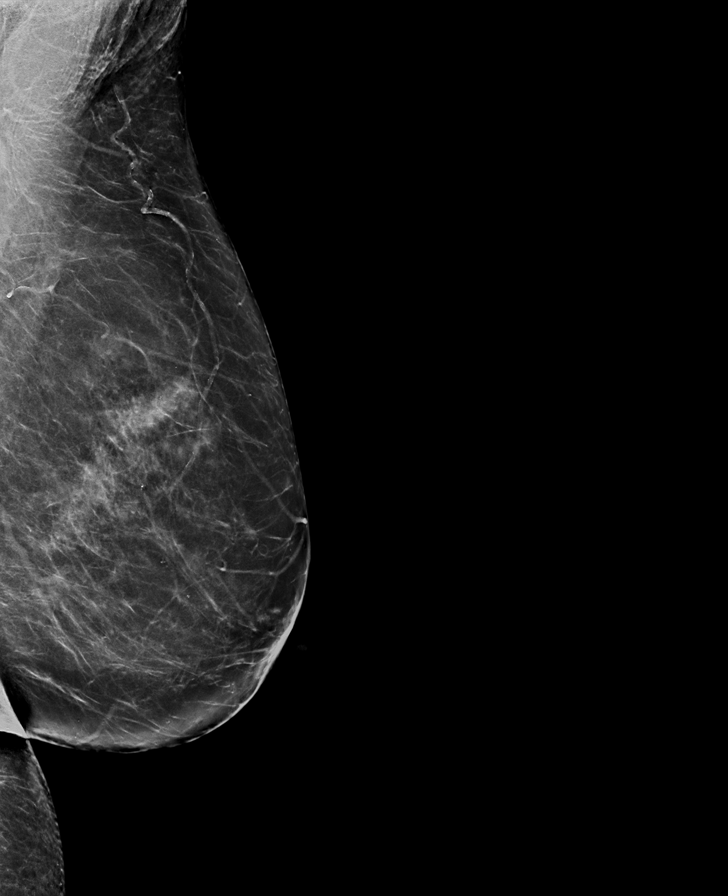

[L CC synth-2D]
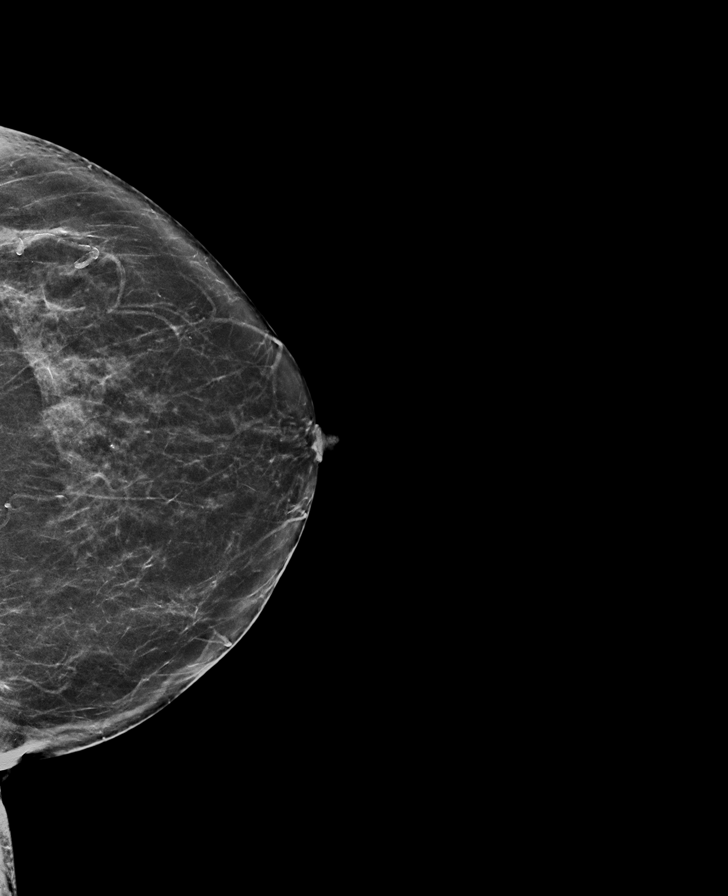

[R MLO synth-2D]
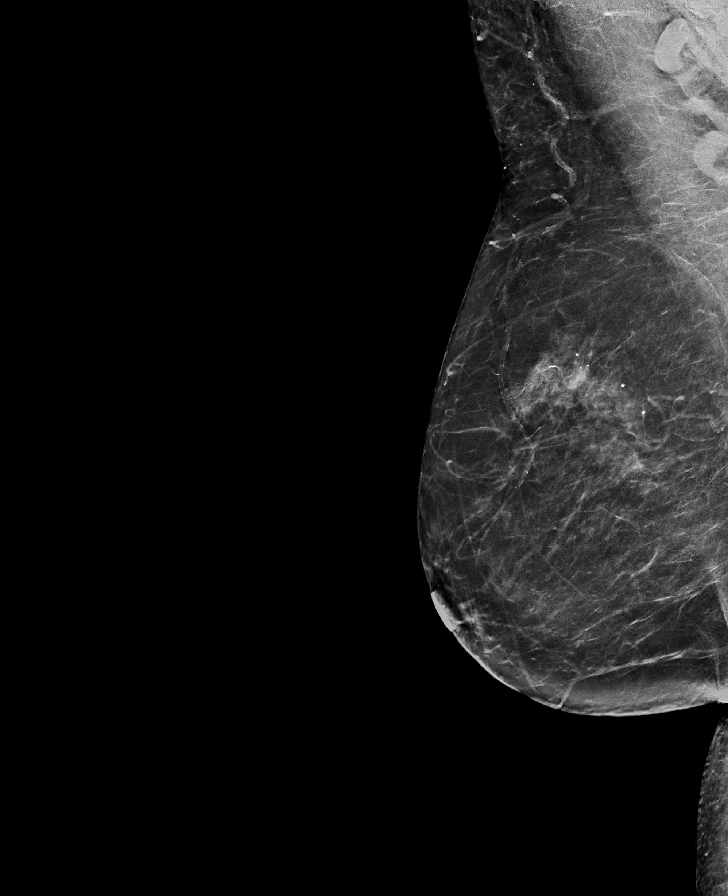

[R CC synth-2D]
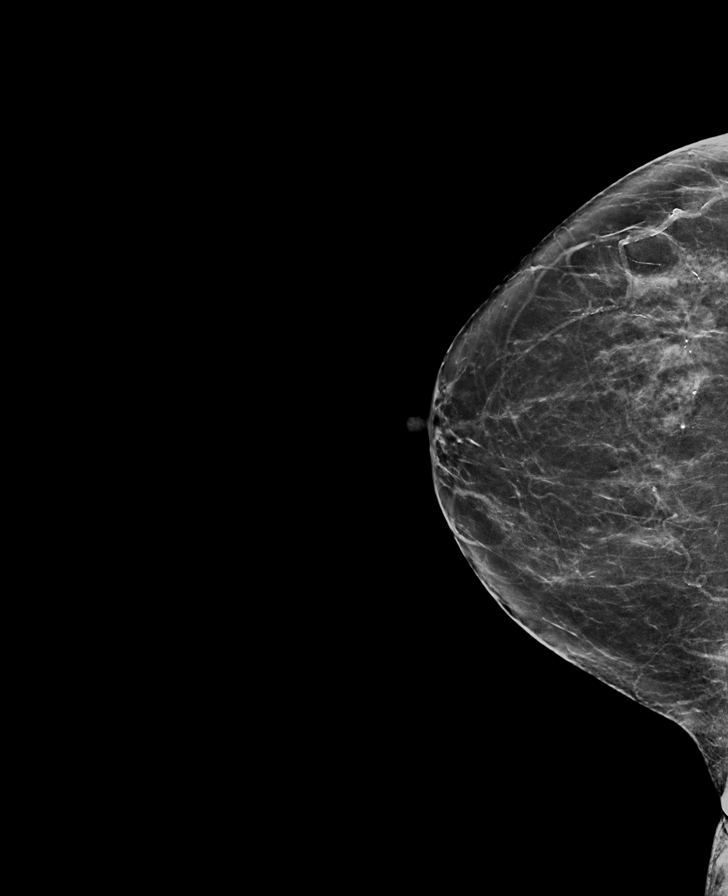

[L CC tomo · tomo slice 33/64.0]
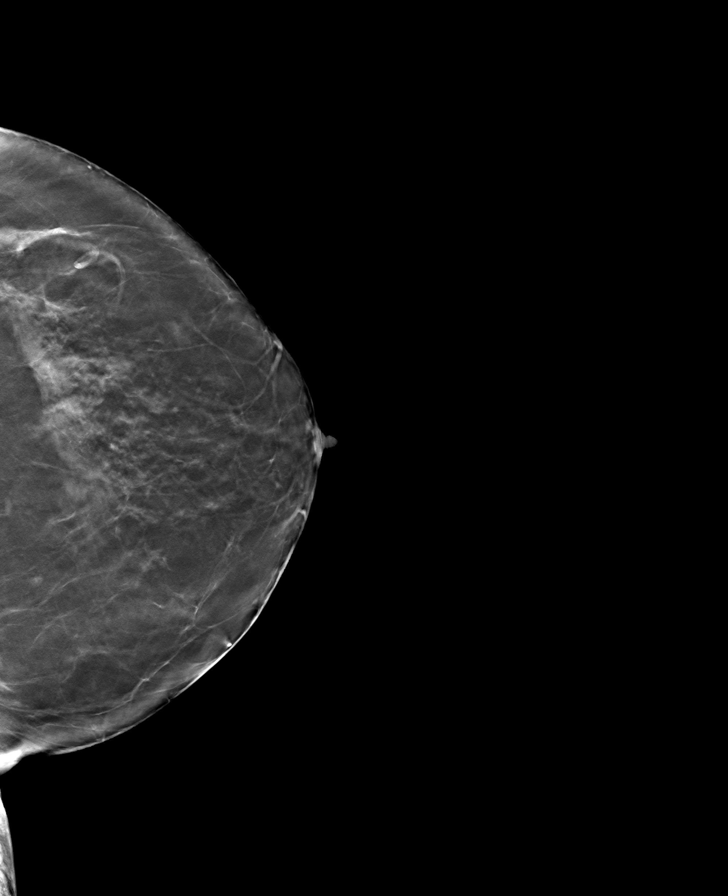

[R CC tomo · tomo slice 33/65.0]
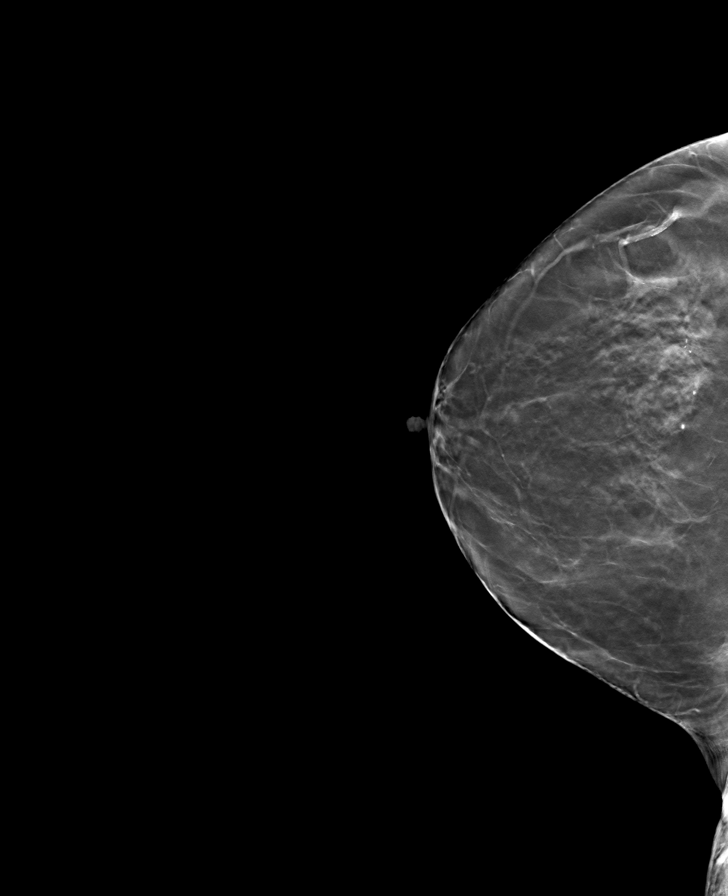

[L MLO tomo · tomo slice 38/75.0]
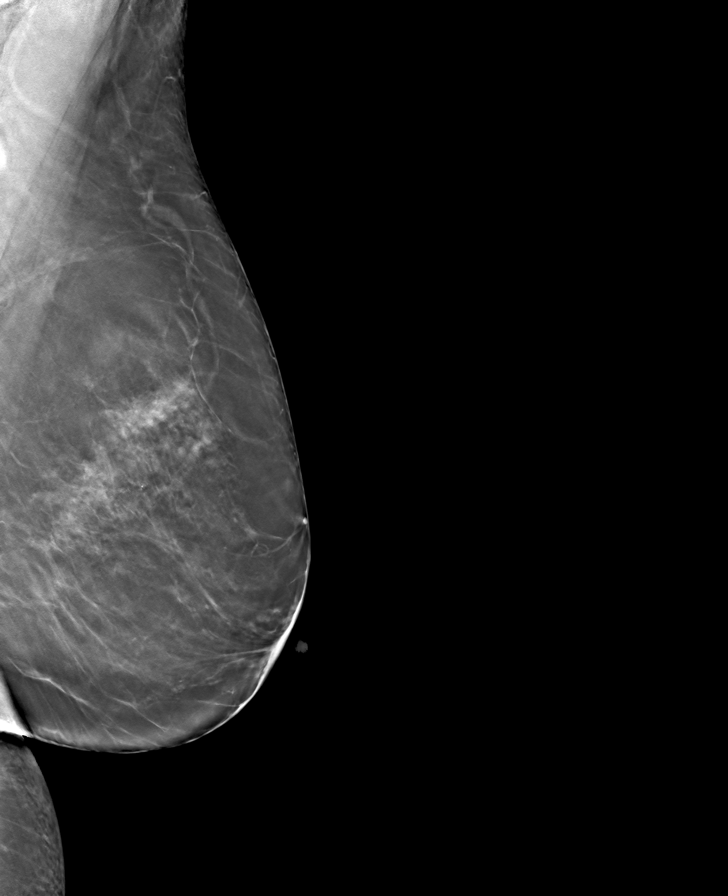

[R MLO tomo · tomo slice 37/72.0]
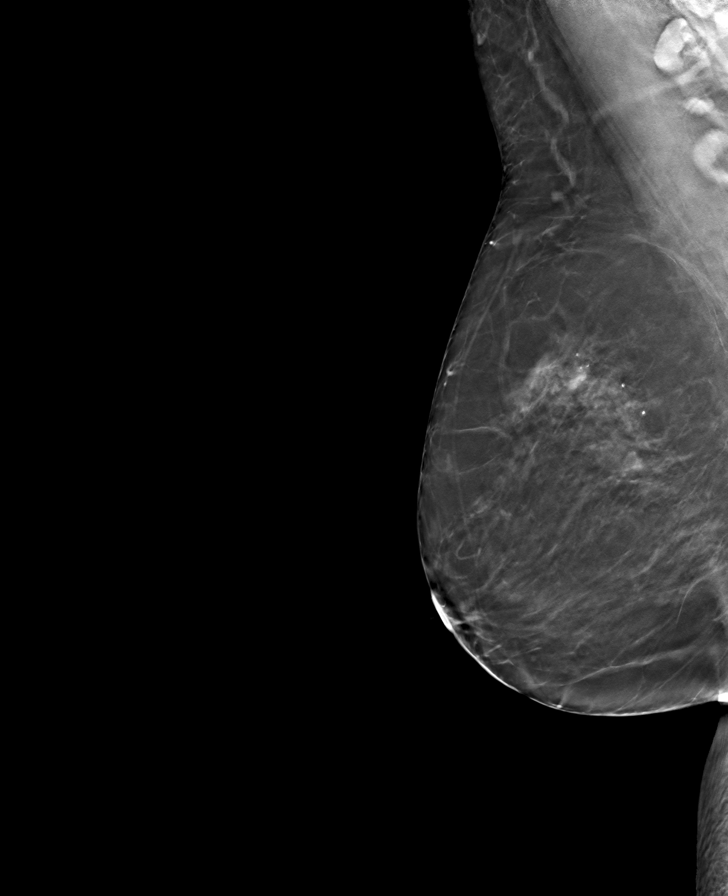

[8 of 24 positions shown; findings below may reference images not displayed]

ACR Breast Density Category b: There are scattered areas of
fibroglandular density.
FINDINGS: There are no findings suspicious for malignancy.
IMPRESSION: No mammographic evidence of malignancy. A result letter of this
screening mammogram will be mailed directly to the patient.

RECOMMENDATION:
Screening mammogram in one year. (Code:51-O-LD2)

BI-RADS CATEGORY  1: Negative.

## 2023-08-10 NOTE — Unmapped (Signed)
 [facility] Special[facility] and Home Delivery Pharm[facility] Refill Coordination Note    Specialty Medication(s) to be Shipped:   CF/Pulmonary/Asthma: Dupixent     Other medication(s) to be shipped: No additional medications requested for fill at this time     Jasmine Carson, DOB: [birth year]  Phone: [phone] (home)       All above HIPAA information was verified with patient.     Was a Nurse, learning disability used for this call? No    Completed refill call assessment today to schedule patient's medication shipment from the [facility] and Home Delivery Pharmacy  [phone]).  All relevant notes have been reviewed.     Specialty medication(s) and dose(s) confirmed: Regimen is correct and unchanged.   Changes to medications: Jerrica reports no changes at this time.  Changes to insurance: No  New side effects reported not previously addressed with a pharmacist or physician: None reported  Questions for the pharmacist: No    Confirmed patient received a Conservation officer, historic buildings and a Surveyor, mining with first shipment. The patient will receive a drug information handout for each medication shipped and additional FDA Medication Guides as required.       DISEASE/MEDICATION-SPECIFIC INFORMATION        For patients on injectable medications: Patient currently has 1 doses left.  Next injection is scheduled for 08/13/23.    SPECIALTY MEDICATION ADHERENCE     Medication Adherence    Patient reported X missed doses in the last month: 0  Specialty Medication: DUPIXENT  PEN 300 mg/2 mL pen injector (dupilumab )  Patient is on additional specialty medications: No  Patient is on more than two specialty medications: No  Any gaps in refill history greater than 2 weeks in the last 3 months: no  Demonstrates understanding of importance of adherence: yes              Were doses missed due to medication being on hold? No    DUPIXENT  PEN 300  mg/ml: 14 days of medicine on hand       REFERRAL TO PHARMACIST     Referral to the pharmacist: Not needed      [facility] Shipping address confirmed in Epic.     Cost and Payment: Patient has a $0 copay, payment information is not required.    Delivery Scheduled: Yes, Expected medication delivery date: 08/15/23.     Medication will be delivered via Same Day Courier to the prescription address in Epic WAM.    Stephen Ehrlich   [facility] Special[facility] and Home Delivery Pharmacy  Specialty Technician

## 2023-08-15 MED FILL — DUPIXENT 300 MG/2 ML SUBCUTANEOUS PEN INJECTOR: SUBCUTANEOUS | 28 days supply | Qty: 4 | Fill #6

## 2023-08-21 ENCOUNTER — Ambulatory Visit: Attending: Orthopedic Surgery | Admitting: Occupational Therapy

## 2023-09-05 NOTE — Unmapped (Signed)
 [facility] Specialty and Home Delivery Pharmacy Refill Coordination Note    Specialty Medication(s) to be Shipped:   CF/Pulmonary/Asthma: Dupixent     Other medication(s) to be shipped: No additional medications requested for fill at this time     Jasmine Carson, DOB: 1950-07-17  Phone: [phone] (home)       All above HIPAA information was verified with patient.     Was a Nurse, learning disability used for this call? No    Completed refill call assessment today to schedule patient's medication shipment from the [facility] and Home Delivery Pharmacy  [phone]).  All relevant notes have been reviewed.     Specialty medication(s) and dose(s) confirmed: Regimen is correct and unchanged.   Changes to medications: Annamay reports no changes at this time.  Changes to insurance: No  New side effects reported not previously addressed with a pharmacist or physician: None reported  Questions for the pharmacist: No    Confirmed patient received a Conservation officer, historic buildings and a Surveyor, mining with first shipment. The patient will receive a drug information handout for each medication shipped and additional FDA Medication Guides as required.       DISEASE/MEDICATION-SPECIFIC INFORMATION        For patients on injectable medications: Patient currently has 1 doses left.  Next injection is scheduled for 09/10/23.    SPECIALTY MEDICATION ADHERENCE     Medication Adherence    Patient reported X missed doses in the last month: 0  Specialty Medication: DUPIXENT  PEN 300 mg/2 mL pen injector (dupilumab )  Patient is on additional specialty medications: No  Patient is on more than two specialty medications: No  Any gaps in refill history greater than 2 weeks in the last 3 months: no  Demonstrates understanding of importance of adherence: yes              Were doses missed due to medication being on hold? No    DUPIXENT  PEN 300   mg/ml: 14 days of medicine on hand       REFERRAL TO PHARMACIST     Referral to the pharmacist: Not needed      [facility] Shipping address confirmed in Epic.     Cost and Payment: Patient has a $0 copay, payment information is not required.    Delivery Scheduled: Yes, Expected medication delivery date: 09/11/23.     Medication will be delivered via Same Day Courier to the prescription address in Epic WAM.    Stephen Ehrlich   [facility] Specialty and Home Delivery Pharmacy  Specialty Technician

## 2023-09-11 MED FILL — DUPIXENT 300 MG/2 ML SUBCUTANEOUS PEN INJECTOR: SUBCUTANEOUS | 28 days supply | Qty: 4 | Fill #7

## 2023-10-05 NOTE — Unmapped (Signed)
 [facility] Specialty and Home Delivery Pharmacy Refill Coordination Note    Specialty Medication(s) to be Shipped:   CF/Pulmonary/Asthma: Dupixent     Other medication(s) to be shipped: No additional medications requested for fill at this time     Jasmine Carson, DOB: 08/17/1950  Phone: [phone] (home)       All above HIPAA information was verified with patient.     Was a Nurse, learning disability used for this call? No    Completed refill call assessment today to schedule patient's medication shipment from the [facility] and Home Delivery Pharmacy  [phone]).  All relevant notes have been reviewed.     Specialty medication(s) and dose(s) confirmed: Regimen is correct and unchanged.   Changes to medications: Jasmine Carson reports no changes at this time.  Changes to insurance: No  New side effects reported not previously addressed with a pharmacist or physician: None reported  Questions for the pharmacist: No    Confirmed patient received a Conservation officer, historic buildings and a Surveyor, mining with first shipment. The patient will receive a drug information handout for each medication shipped and additional FDA Medication Guides as required.       DISEASE/MEDICATION-SPECIFIC INFORMATION        For patients on injectable medications: Patient currently has 1 doses left.  Next injection is scheduled for 10/08/23.    SPECIALTY MEDICATION ADHERENCE     Medication Adherence    Patient reported X missed doses in the last month: 0  Specialty Medication: DUPIXENT  PEN 300 mg/2 mL pen injector (dupilumab )  Patient is on additional specialty medications: No  Patient is on more than two specialty medications: No  Any gaps in refill history greater than 2 weeks in the last 3 months: no  Demonstrates understanding of importance of adherence: yes  Informant: patient  Confirmed plan for next specialty medication refill: delivery by pharmacy  Refills needed for supportive medications: not needed          Refill Coordination    Has the Patients' Contact Information Changed: No  Is the Shipping Address Different: No         Were doses missed due to medication being on hold? No    dupilumab : DUPIXENT  PEN 300 /2  mg/ml: 1 doses of medicine on hand       REFERRAL TO PHARMACIST     Referral to the pharmacist: Not needed      [facility]     Shipping address confirmed in Epic.     Cost and Payment: Patient has a $0 copay, payment information is not required.    Delivery Scheduled: Yes, Expected medication delivery date: 10/11/23.     Medication will be delivered via Same Day Courier to the prescription address in Epic WAM.    [name]   [facility] Specialty and Home Delivery Pharmacy  Specialty Technician

## 2023-10-11 MED FILL — DUPIXENT 300 MG/2 ML SUBCUTANEOUS PEN INJECTOR: SUBCUTANEOUS | 28 days supply | Qty: 4 | Fill #8

## 2023-10-31 NOTE — Unmapped (Signed)
 [facility] Specialty and Home Delivery Pharmacy Refill Coordination Note    Specialty Medication(s) to be Shipped:   CF/Pulmonary/Asthma: Dupixent     Other medication(s) to be shipped: No additional medications requested for fill at this time    Specialty Medications not needed at this time: N/A     Jasmine Carson, DOB: [birth year]  Phone: [phone] (home)       All above HIPAA information was verified with patient.     Was a Nurse, learning disability used for this call? No    Completed refill call assessment today to schedule patient's medication shipment from the [facility] and Home Delivery Pharmacy  [phone]).  All relevant notes have been reviewed.     Specialty medication(s) and dose(s) confirmed: Regimen is correct and unchanged.   Changes to medications: Zacari reports no changes at this time.  Changes to insurance: No  New side effects reported not previously addressed with a pharmacist or physician: None reported  Questions for the pharmacist: No    Confirmed patient received a Conservation officer, historic buildings and a Surveyor, mining with first shipment. The patient will receive a drug information handout for each medication shipped and additional FDA Medication Guides as required.       DISEASE/MEDICATION-SPECIFIC INFORMATION        For patients on injectable medications: Patient currently has 0 doses left.  Next injection is scheduled for 11/12/23.    SPECIALTY MEDICATION ADHERENCE     Medication Adherence    Patient reported X missed doses in the last month: 0  Specialty Medication: DUPIXENT  PEN 300 mg/2 mL pen injector (dupilumab )  Patient is on additional specialty medications: No  Patient is on more than two specialty medications: No  Any gaps in refill history greater than 2 weeks in the last 3 months: no  Demonstrates understanding of importance of adherence: yes              Were doses missed due to medication being on hold? No    DUPIXENT  PEN 300 mg/ml: 0 days of medicine on hand       REFERRAL TO PHARMACIST Referral to the pharmacist: Not needed      [facility]     Shipping address confirmed in Epic.     Cost and Payment: Patient has a $0 copay, payment information is not required.    Delivery Scheduled: Yes, Expected medication delivery date: 11/07/23.     Medication will be delivered via Same Day Courier to the prescription address in Epic WAM.    [name] Myron   [facility] Specialty and Home Delivery Pharmacy  Specialty Technician

## 2023-11-07 MED FILL — DUPIXENT 300 MG/2 ML SUBCUTANEOUS PEN INJECTOR: SUBCUTANEOUS | 28 days supply | Qty: 4 | Fill #9

## 2023-11-12 DIAGNOSIS — J4540 Moderate persistent asthma, uncomplicated: Principal | ICD-10-CM

## 2023-11-12 DIAGNOSIS — L209 Atopic dermatitis, unspecified: Principal | ICD-10-CM

## 2023-11-12 DIAGNOSIS — J310 Chronic rhinitis: Principal | ICD-10-CM

## 2023-11-12 NOTE — Unmapped (Signed)
 Allergy/Immunology   Clinic Note       Reason for visit: eczema, asthma, rhinitis    Assessment and Plan:   Diagnoses:  1. Atopic dermatitis, unspecified type    2. Mixed rhinitis    3. Moderate persistent asthma, unspecified whether complicated (HHS-HCC)          Assessment and Plan:  Jasmine Carson is a 73 y.o. female seen for the following:    Mixed Rhinitis: The history is consistent with both seasonal and perennial allergies. Blood testing confirmed sensitivity to mold (02/2021).   Medications are as described below.   - Flonase  nasal spray, 2  sprays per nostril BID    - Astelin 1 spay daily  - Cetirizine (Zyrtec) 10 mg daily as needed if still having symptoms     Atopic Dermatitis:  Jasmine Carson has moderate atopic dermatitis, which is well controlled.   - treat eczema flares on the body with triamcinolone  0.1% ointment  and eczema flares on the face (or mild flares) with hydrocortisone 2.5% ointment , 2 times a day for 5-7 days to affected areas on body  - daily baths in warm water with moisturizing soon after bathing.  Minimize soap use, and use instead moisturizing bars, like those made by [employer] of [place], Aveeno, Cetaphil, Cerave.   - use unscented thick moisturizer ointment/cream that comes in a tub, like petroleum jelly / Vaseline, Aquaphor, Cerave Healing Ointment (not the renewing cream),  or Cetaphil   - avoid lotions and liquid soaps as they can be drying.  Avoid scrubbing skin with washcloths/loofahs.    - Continue Dupixent  300mg  every 14 days. We discussed potentially weaning Dupixent  at next visit if symptoms remain well controlled.     Moderate Persistent Asthma, poorly controlled  Symptoms are well controlled, but prior spirometry showed evidence of reversible small airway obstruction but normal FEV1. She is using low-dose Symbicort  2x/week for cough with improvement. Recommended initiating more consistent Symbicort  usage and repeating spirometry at next visit.   - Start Symbicort  80-4.38mcg 2 puffs BID  - Continue Dupixent  300mg  every 14 days  - Repeat spirometry at follow up appointment  - If symptoms are well controlled, will consider weaning Dupixent  at next appointment.    Amoxicillin  allergy  Low risk based on history (benign rash >5 years ago). Will proceed with 10-90 amox challenge as a nursing visit. Will be off of anti histamines for 5 days prior.    Follow-up:  Return in about 3 months (around 02/12/2024).    Patient discussed with the attending, Dr. Shellia, with whom the above assessment and plan were jointly formulated.    --  Comer Penna, MD  [facility] Allergy/Immunology Fellow    I personally spent 40 minutes face-to-face and non-face-to-face in the care of this patient, which includes all pre, intra, and post visit time on the date of service.    Subjective:   HPI:  I had the pleasure of seeing Jasmine Carson in allergy/immunology clinic today, and she is a 73 y.o. female w/ a hx of hypertension, hyperlipidemia, CKD stage III, mild intermittent reactive airway disease, chronic cough, prediabetes, GERD, former tobacco use seen for follow up of eczema, allergic rhinitis, and asthma. A thorough review of the available medical records is also performed.    She was last seen 06/18/2023. At that time, eczema and rhinitis were well controlled. Spirometry showed reversible small airway obstruction, and it was recommended that she start Symbicort  80 2 puffs BID.    -  Rhinitis: using Flonase  and Azelastine twice daily. Symptoms currently well controlled.  - Eczema: well controlled with Dupixent  300mg  every 2 weeks. No flares since last visit.  - Asthma: using low-dose Symbicort  2x/week for cough with improvement. She has not started using Symbicort  regularly because of forgetting it. Denies wheezing or shortness of breath. She recently had COVID and did not have any trouble breathing from that. She does note that her asthma symptoms have been better controlled since starting Dupixent .    Review of Systems:  As per HPI, all other systems reviewed are negative.    Past Medical History:     Past Medical History:   Diagnosis Date    Carpal tunnel syndrome     CKD (chronic kidney disease)     Eczema     Gastroesophageal reflux     Hypertension     Seasonal allergies     Type 2 diabetes mellitus without complication, without long-term current use of insulin     07/26/2021       Past Surgical History:   Procedure Laterality Date    CARPAL TUNNEL RELEASE Right     HYSTERECTOMY         Medications:     Current Outpatient Medications   Medication Sig Dispense Refill    ACCU-CHEK GUIDE TEST STRIPS Strp       albuterol  HFA 90 mcg/actuation inhaler Inhale 2 puffs every six (6) hours as needed for wheezing or shortness of breath. 8 g 0    alendronate (FOSAMAX) 70 MG tablet TAKE 1 TABLET BY MOUTH ONCE A WEEK      amLODIPine (NORVASC) 5 MG tablet Take 1 tablet (5 mg total) by mouth daily.      azelastine (ASTELIN) 137 mcg (0.1 %) nasal spray PLACE 1 SPRAY INTO BOTH NOSTRILS 2 (TWO) TIMES DAILY      budesonide -formoterol  (SYMBICORT ) 80-4.5 mcg/actuation inhaler Inhale 2 puffs two (2) times a day. 10.2 g 11    buPROPion (WELLBUTRIN XL) 150 MG 24 hr tablet Take 1 tablet (150 mg total) by mouth daily.      calcium carbonate-vitamin D3 (CALCIUM 600 WITH VITAMIN D3) 600 mg(1,500mg ) -400 unit cap Take 1 capsule by mouth once daily. 30 each 0    cetirizine (ZYRTEC) 10 MG tablet Take 1 tablet (10 mg total) by mouth daily.      dupilumab  (DUPIXENT  PEN) 300 mg/2 mL pen injector Inject the contents of 1 pen (300 mg total) under the skin every fourteen (14) days. 4 mL 11    empty container Misc Use as directed 1 each 0    EPINEPHrine  (EPIPEN ) 0.3 mg/0.3 mL injection Inject 0.3 mL (0.3 mg total) into the muscle once as needed for anaphylaxis. 1 each 1    fluticasone  propionate (FLONASE ) 50 mcg/actuation nasal spray 2 sprays into each nostril two (2) times a day. 32 g= 1 month supply 32 g 11    losartan  (COZAAR ) 50 MG tablet Take 0.5 tablets (25 mg total) by mouth in the morning for 20 days. 10 tablet 0    lovastatin (MEVACOR) 40 MG tablet Take 1 tablet (40 mg total) by mouth daily with evening meal.      metFORMIN (GLUCOPHAGE-XR) 500 MG 24 hr tablet TAKE 1 TABLET BY MOUTH EVERY DAY WITH DINNER      metoprolol succinate (TOPROL-XL) 25 MG 24 hr tablet Take 1 tablet (25 mg total) by mouth in the morning.      montelukast (SINGULAIR) 10 mg tablet Take  1 tablet (10 mg total) by mouth.      multivitamin-Ca-iron-minerals Tab Take 1 tablet by mouth daily.      omeprazole (PRILOSEC) 20 MG capsule Take 1 capsule (20 mg total) by mouth in the morning.      traZODone (DESYREL) 50 MG tablet       triamcinolone  (KENALOG ) 0.1 % cream        No current facility-administered medications for this visit.       Allergies:     Allergies   Allergen Reactions    Sulfamethoxazole      Other reaction(s): RASH    Azithromycin Rash    Zolpidem Other (See Comments)     sleepwalking  Other reaction(s): Other (See Comments)  sleepwalking  Other reaction(s): Other (See Comments)  sleepwalking      Amoxicillin  Rash    Atorvastatin Nausea Only       Family History:     Family History   Problem Relation Age of Onset    COPD Neg Hx     Lung cancer Neg Hx     Asthma Neg Hx        Social History:     Social History     Socioeconomic History    Marital status: Married   Tobacco Use    Smoking status: Former     Types: Cigarettes     Passive exposure: Current    Smokeless tobacco: Former     Types: Snuff   Vaping Use    Vaping status: Never Used   Substance and Sexual Activity    Alcohol use: Yes     Alcohol/week: 2.0 standard drinks of alcohol     Types: 2 Glasses of wine per week     Comment: 2 drinks weekly    Drug use: No    Sexual activity: Not Currently     Social Drivers of Health     Financial Resource Strain: Low Risk  (06/13/2023)    Received from [facility]    Overall Financial Resource Strain (CARDIA)     Difficulty of Paying Living Expenses: Not hard at all   Food Insecurity: No Food Insecurity (06/13/2023)    Received from [facility]    Hunger Vital Sign     Within the past 12 months, you worried that your food would run out before you got the money to buy more.: Never true     Within the past 12 months, the food you bought just didn't last and you didn't have money to get more.: Never true   Transportation Needs: No Transportation Needs (06/13/2023)    Received from [facility] - Transportation     In the past 12 months, has lack of transportation kept you from medical appointments or from getting medications?: No     Lack of Transportation (Non-Medical): No   Physical Activity: Insufficiently Active (03/14/2017)    Received from [facility]    Exercise Vital Sign     Days of Exercise per Week: 3 days     Minutes of Exercise per Session: 30 min   Stress: No Stress Concern Present (03/14/2017)    Received from [facility] - Occupational Stress Questionnaire     Feeling of Stress : Only a little   Social Connections: Socially Integrated (03/14/2017)    Received from [facility]  Social Advertising account executive     Frequency of Communication with Friends and Family: More than three times a week     Frequency of Social Gatherings with Friends and Family: Not asked     Attends Religious Services: More than 4 times per year     Active Member of [employer] or Organizations: Yes     Attends Engineer, structural: More than 4 times per year     Marital Status: Married   Housing: Low Risk  (06/13/2023)    Received from [employer] Stability Vital Sign     In the last 12 months, was there a time when you were not able to pay the mortgage or rent on time?: No     In the past 12 months, how many times have you moved where you were living?: 0     At any time in the past 12 months, were you homeless or living in a shelter (including now)?: No       Objective:   PE:   Vitals:    11/12/23 0808   BP: 151/107   BP Site: L Arm   BP Position: Sitting   Pulse: 85   SpO2: 98%         General:  Well nourished female in no acute distress without toxic appearance.  Skin:  No eczematous patches or rash. No dermatographism.  HEENT:  No conjunctival injection, non-prominent infraorbital folds; Anterior nasal examination: pink nasal mucosa, non-edematous inferior turbinates, no nasal septal deviation or visible polyps; granular oropharynx without exudates, ulcerations, or thrush;   CV: Regular rhythm; normal S1 and S2; no murmur, gallop or rub.  Respiratory:  Adequate inspiratory effort. Clear to auscultation bilaterally. No wheezes, crackles, or rhonchi.  Neurologic:  Alert and mental status appropriate for age; normal gait.  Musculoskeletal:  No peripheral edema. Normal bulk for age  Psychiatric: Relaxed, friendly, and cooperative with interview and examination. Euthymic affect. Normal thought process and thought content.    Investigations    Spirometry 06/18/2023:      Spirometry Prior   to Bronchodilator Spirometry After   Bronchodilator % change   FVC (L) 2.33 2.27    FVC (% pred 84.2 82 -2.7   FEV1 (L) 1.64 1.67    FEV1 (% pred) 76 77.6 1.7   FEV1/FVC  70 73 4.5   FEF25-75% (L/sec) 0.99 1.13    FEF25-75% (% pred) 57 65 14.2   Technique Repeatable flow volume curves; appropriate for interpretation           Interpretation:   Reversible small airway obstruction. Fixed large airway obstruction.      Laboratory testing reviewed and pertinent for the following:    No visits with results within 4 Week(s) from this visit.   Latest known visit with results is:   Office Visit on 10/26/2021   Component Date Value    IgE, Total 10/26/2021 58.9

## 2023-11-12 NOTE — Unmapped (Signed)
-   Continue your Flonase  and Azelastine nose sprays twice daily  - Continue Dupixent  every 2 weeks  - Start Symbicort  2 puffs twice daily using a spacer  - At your next appointment, if you are doing well, let's talk more about trying to space out your Dupixent  doses        Correct Use of MDI and Spacer with Mouthpiece    Below are the steps for the correct use of a metered dose inhaler (MDI) and spacer with MOUTHPIECE.  Patient should perform the following steps:  1.  Shake the canister for 5 seconds.  2.  Prime the MDI. (Varies depending on MDI brand, see package insert.) In general:  -If MDI not used in 2 weeks or has been dropped: spray 2 puffs into air  -If MDI never used before spray 3 puffs into air  3.  Insert the MDI into the spacer.  4.  Place the spacer mouthpiece into your mouth between the teeth.  5.  Close your lips around the mouthpiece and exhale normally.  6.  Press down the top of the canister to release 1 puff of medicine.  7.  Inhale the medicine through the mouth deeply and slowly (3-5 seconds spacer whistles when breathing in too fast.   8.  Hold your breath for 10 seconds and remove the spacer from your mouth before exhaling.  9.  Wait one minute before giving another puff of the medication.  10.Caregiver supervises and advises in the process of medicatin administration with spacer.              11.Repeat steps 4 through 8 depending on how many puffs are indicated on the prescription.  Cleaning Instructions   1. Remove the rubber end of spacer where the MDI fits.   2. Rotate spacer mouthpiece counter-clockwise and lift up to remove.   3. Lift the valve off the clear posts at the end of the chamber.   4. Soak the parts in warm water with clear, liquid detergent for about 15 minutes.   5. Rinse in clean water and shake to remove excess water.   6. Allow all parts to air dry. DO NOT dry with a towel.    7. To reassemble, hold chamber upright and place valve over clear posts. Replace spacer mouthpiece and turn it clockwise until it locks into place. Replace the back rubber end onto the spacer.       For more information, go to http://bit.ly/[employer]sthmaEducation.

## 2023-11-28 NOTE — Unmapped (Signed)
 [facility] Delivery Pharm[facility] Refill Coordination Note    Specialty Medication(s) to be Shipped:   CF/Pulmonary/Asthma: Dupixent     Other medication(s) to be shipped: No additional medications requested for fill at this time    Specialty Medications not needed at this time: N/A     Jasmine Carson, DOB: 10/11/1950  Phone: [phone] (home)       All above HIPAA information was verified with patient.     Was a Nurse, learning disability used for this call? No    Completed refill call assessment today to schedule patient's medication shipment from the [facility] and Home Delivery Pharmacy  [phone]).  All relevant notes have been reviewed.     Specialty medication(s) and dose(s) confirmed: Regimen is correct and unchanged.   Changes to medications: [name] reports no changes at this time.  Changes to insurance: No  New side effects reported not previously addressed with a pharmacist or physician: None reported  Questions for the pharmacist: No    Confirmed patient received a Conservation officer, historic buildings and a Surveyor, mining with first shipment. The patient will receive a drug information handout for each medication shipped and additional FDA Medication Guides as required.       DISEASE/MEDICATION-SPECIFIC INFORMATION        For patients on injectable medications: Next injection is scheduled for 12/10/23.    SPECIALTY MEDICATION ADHERENCE     Medication Adherence    Patient reported X missed doses in the last month: 0  Specialty Medication: DUPIXENT  PEN 300 mg/2 mL pen injector (dupilumab )  Patient is on additional specialty medications: No  Patient is on more than two specialty medications: No  Any gaps in refill history greater than 2 weeks in the last 3 months: no  Demonstrates understanding of importance of adherence: yes              Were doses missed due to medication being on hold? No    DUPIXENT  PEN 300   mg/ml: 0 days of medicine on hand       REFERRAL TO PHARMACIST     Referral to the pharmacist: Not needed      [facility]     Shipping address confirmed in Epic.     Cost and Payment: Patient has a $0 copay, payment information is not required.    Delivery Scheduled: Yes, Expected medication delivery date: 12/05/23.     Medication will be delivered via Same Day Courier to the prescription address in Epic WAM.    Jasmine Carson   [facility] Delivery Pharm[facility]  Specialty Technician

## 2023-12-05 MED FILL — DUPIXENT 300 MG/2 ML SUBCUTANEOUS PEN INJECTOR: SUBCUTANEOUS | 28 days supply | Qty: 4 | Fill #10

## 2023-12-26 NOTE — Unmapped (Signed)
 [facility] and Home Delivery Pharmacy Refill Coordination Note    Specialty Medication(s) to be Shipped:   CF/Pulmonary/Asthma: Dupixent     Other medication(s) to be shipped: No additional medications requested for fill at this time    Specialty Medications not needed at this time: N/A     Jasmine Carson, DOB: 08-Jul-1950  Phone: [phone] (home)       All above HIPAA information was verified with patient.     Was a Nurse, learning disability used for this call? No    Completed refill call assessment today to schedule patient's medication shipment from the [facility] and Home Delivery Pharmacy  [phone]).  All relevant notes have been reviewed.     Specialty medication(s) and dose(s) confirmed: Regimen is correct and unchanged.   Changes to medications: Emmalyne reports no changes at this time.  Changes to insurance: No  New side effects reported not previously addressed with a pharmacist or physician: None reported  Questions for the pharmacist: No    Confirmed patient received a Conservation officer, historic buildings and a Surveyor, mining with first shipment. The patient will receive a drug information handout for each medication shipped and additional FDA Medication Guides as required.       DISEASE/MEDICATION-SPECIFIC INFORMATION        For patients on injectable medications: Next injection is scheduled for 01/07/24.    SPECIALTY MEDICATION ADHERENCE     Medication Adherence    Patient reported X missed doses in the last month: 0  Specialty Medication: DUPIXENT  PEN 300 mg/2 mL pen injector (dupilumab )  Patient is on additional specialty medications: No  Patient is on more than two specialty medications: No  Any gaps in refill history greater than 2 weeks in the last 3 months: no  Demonstrates understanding of importance of adherence: yes              Were doses missed due to medication being on hold? No    DUPIXENT  PEN 300 mg/ml: 0 days of medicine on hand     REFERRAL TO PHARMACIST     Referral to the pharmacist: Not needed      [facility]     Shipping address confirmed in Epic.     Cost and Payment: Patient has a $0 copay, payment information is not required.    Delivery Scheduled: Yes, Expected medication delivery date: 01/02/24.     Medication will be delivered via Same Day Courier to the prescription address in Epic WAM.    Jasmine Carson   [facility] Delivery [facility]  Specialty Technician

## 2024-01-01 MED FILL — DUPIXENT 300 MG/2 ML SUBCUTANEOUS PEN INJECTOR: SUBCUTANEOUS | 28 days supply | Qty: 4 | Fill #11

## 2024-01-23 DIAGNOSIS — Z889 Allergy status to unspecified drugs, medicaments and biological substances status: Principal | ICD-10-CM

## 2024-01-23 DIAGNOSIS — J4521 Mild intermittent asthma with (acute) exacerbation: Principal | ICD-10-CM

## 2024-01-23 DIAGNOSIS — L209 Atopic dermatitis, unspecified: Principal | ICD-10-CM

## 2024-01-23 MED ORDER — DUPIXENT 300 MG/2 ML SUBCUTANEOUS PEN INJECTOR
SUBCUTANEOUS | 11 refills | 28.00000 days
Start: 2024-01-23 — End: 2025-01-22

## 2024-01-23 NOTE — Progress Notes (Signed)
 [facility] Specialty and Home Delivery Pharmacy Refill Coordination Note    Specialty Medication(s) to be Shipped:   CF/Pulmonary/Asthma: Dupixent     Other medication(s) to be shipped: No additional medications requested for fill at this time    Specialty Medications not needed at this time: N/A     Jasmine Carson, DOB: 1950/07/19  Phone: [phone] (home)       All above HIPAA information was verified with patient.     Was a nurse, learning disability used for this call? No    Completed refill call assessment today to schedule patient's medication shipment from the [facility] and Home Delivery Pharmacy  [phone]).  All relevant notes have been reviewed.     Specialty medication(s) and dose(s) confirmed: Regimen is correct and unchanged.   Changes to medications: Aerielle reports no changes at this time.  Changes to insurance: No  New side effects reported not previously addressed with a pharmacist or physician: None reported  Questions for the pharmacist: No    Confirmed patient received a Conservation Officer, Historic Buildings and a Surveyor, Mining with first shipment. The patient will receive a drug information handout for each medication shipped and additional FDA Medication Guides as required.       DISEASE/MEDICATION-SPECIFIC INFORMATION        For patients on injectable medications: Next injection is scheduled for 01/04/24 .    SPECIALTY MEDICATION ADHERENCE     Medication Adherence    Patient reported X missed doses in the last month: 0  Specialty Medication: DUPIXENT  PEN 300 mg/2 mL pen injector (dupilumab )  Patient is on additional specialty medications: No  Patient is on more than two specialty medications: No  Any gaps in refill history greater than 2 weeks in the last 3 months: no  Demonstrates understanding of importance of adherence: yes              Were doses missed due to medication being on hold? No        dupilumab : DUPIXENT  PEN 300 mg/2 mL pen injector: 1 doses of medicine on hand        REFERRAL TO PHARMACIST Referral to the pharmacist: Not needed      SHIPPING     Shipping address confirmed in Epic.     Cost and Payment: Patient has a $0 copay, payment information is not required.    Delivery Scheduled: Yes, Expected medication delivery date: 01/31/24 .  However, Rx request for refills was sent to the provider as there are none remaining.     Medication will be delivered via Same Day Courier to the prescription address in Epic WAM.    Tawni Daring   [facility] Special[facility] and Home Delivery Pharm[facility]  Specialty Technician

## 2024-01-24 MED ORDER — DUPIXENT 300 MG/2 ML SUBCUTANEOUS PEN INJECTOR
SUBCUTANEOUS | 11 refills | 28.00000 days | Status: CP
Start: 2024-01-24 — End: 2025-01-23
  Filled 2024-01-31: qty 4, 28d supply, fill #0

## 2024-01-24 NOTE — Telephone Encounter (Signed)
 Patient last seen 10/2023.  Refill sent per protocol.

## 2024-02-21 NOTE — Progress Notes (Signed)
 [facility] Refill Coordination Note    Specialty Medication(s) to be Shipped:   CF/Pulmonary/Asthma: Dupixent     Other medication(s) to be shipped: No additional medications requested for fill at this time    Specialty Medications not needed at this time: N/A     Jasmine Carson, DOB: 15-Jan-1951  Phone: [phone] (home)       All above HIPAA information was verified with patient.     Was a nurse, learning disability used for this call? No    Completed refill call assessment today to schedule patient's medication shipment from the [facility]  [phone]).  All relevant notes have been reviewed.     Specialty medication(s) and dose(s) confirmed: Regimen is correct and unchanged.   Changes to medications: Shatia reports no changes at this time.  Changes to insurance: No  New side effects reported not previously addressed with a pharmacist or physician: None reported  Questions for the pharmacist: No    Confirmed patient received a Conservation Officer, Historic Buildings and a Surveyor, Mining with first shipment. The patient will receive a drug information handout for each medication shipped and additional FDA Medication Guides as required.       DISEASE/MEDICATION-SPECIFIC INFORMATION        For patients on injectable medications: Next injection is scheduled for 03/03/24.    SPECIALTY MEDICATION ADHERENCE     Medication Adherence    Patient reported X missed doses in the last month: 0  Specialty Medication: DUPIXENT  PEN 300 mg/2 mL pen injector (dupilumab )  Patient is on additional specialty medications: No  Patient is on more than two specialty medications: No  Any gaps in refill history greater than 2 weeks in the last 3 months: no  Demonstrates understanding of importance of adherence: yes              Were doses missed due to medication being on hold? No    DUPIXENT  PEN 300  mg/ml: 0 days of medicine on hand       REFERRAL TO PHARMACIST     Referral to the pharmacist: Not needed      [facility]     Shipping address confirmed in Epic.     Cost and Payment: Patient has a $0 copay, payment information is not required.    Delivery Scheduled: Yes, Expected medication delivery date: 02/27/24.     Medication will be delivered via Same Day Courier to the prescription address in Epic WAM.    [name] Myron   [facility]  Specialty Technician

## 2024-02-26 MED FILL — DUPIXENT 300 MG/2 ML SUBCUTANEOUS PEN INJECTOR: SUBCUTANEOUS | 28 days supply | Qty: 4 | Fill #1

## 2024-03-21 ENCOUNTER — Encounter: Payer: Self-pay | Admitting: Gastroenterology

## 2024-03-25 NOTE — Progress Notes (Signed)
 [facility] Refill Coordination Note    Specialty Medication(s) to be Shipped:   CF/Pulmonary/Asthma: Dupixent     Other medication(s) to be shipped: No additional medications requested for fill at this time    Specialty Medications not needed at this time: N/A     Jasmine Carson, DOB: [birth year]  Phone: [phone] (home)       All above HIPAA information was verified with patient.     Was a nurse, learning disability used for this call? No    Completed refill call assessment today to schedule patient's medication shipment from the [facility]  [phone]).  All relevant notes have been reviewed.     Specialty medication(s) and dose(s) confirmed: Regimen is correct and unchanged.   Changes to medications: Lakethia reports no changes at this time.  Changes to insurance: No  New side effects reported not previously addressed with a pharmacist or physician: None reported  Questions for the pharmacist: No    Confirmed patient received a Conservation Officer, Historic Buildings and a Surveyor, Mining with first shipment. The patient will receive a drug information handout for each medication shipped and additional FDA Medication Guides as required.       DISEASE/MEDICATION-SPECIFIC INFORMATION        N/A    SPECIALTY MEDICATION ADHERENCE     Medication Adherence    Patient reported X missed doses in the last month: 0  Specialty Medication: DUPIXENT  PEN 300 mg/2 mL pen injector (dupilumab )  Patient is on additional specialty medications: No              Were doses missed due to medication being on hold? No    DUPIXENT  PEN 300 mg/2 mL pen injector (dupilumab ) : 0 doses of medicine on hand   Specialty medication is an injection or given on a cycle: Yes, Next injection is scheduled for 01/12.    REFERRAL TO PHARMACIST     Referral to the pharmacist: Not needed      [facility]     Shipping address confirmed in Epic.     Cost and Payment: Patient has a copay of $4.90. They are aware and have authorized the pharmacy to charge the credit card on file.    Delivery Scheduled: Yes, Expected medication delivery date: 01/09.     Medication will be delivered via Next Day Courier to the prescription address in Epic WAM.    [facility] Janus    Specialty and Home Delivery Pharmacy  Specialty Technician

## 2024-03-27 MED FILL — DUPIXENT 300 MG/2 ML SUBCUTANEOUS PEN INJECTOR: SUBCUTANEOUS | 28 days supply | Qty: 4 | Fill #2

## 2024-03-27 NOTE — H&P (Signed)
 "  Pre-Procedure H&P   Patient ID: Debra Alexander is a 74 y.o. female.  Gastroenterology Provider: Elspeth Ozell Jungling, DO  Referring Provider: Clotilda Leaven, NP PCP: Leaven Clotilda DEL, NP  Date: 03/28/2024  HPI Ms. Debra Alexander is a 74 y.o. female who presents today for Colonoscopy for Colorectal cancer screening; family history of colon cancer-Brother .  Last colonoscopy in January 2016 with hyperplastic polyps and left-sided diverticulosis.  Normal colonoscopy in 2006.  Patient is status post hysterectomy.  Family history colon cancer-Brother   Past Medical History:  Diagnosis Date   Hyperlipidemia    Hypertension    Osteoporosis     Past Surgical History:  Procedure Laterality Date   ABDOMINAL HYSTERECTOMY     BREAST BIOPSY Right 08/07/2022   Stereo Bx, X clip- path pending   BREAST BIOPSY Right 08/07/2022   MM RT BREAST BX W LOC DEV 1ST LESION IMAGE BX SPEC STEREO GUIDE 08/07/2022 [facility]   BREAST CYST ASPIRATION Left 2007   neg    Family History Brother-CRC No other h/o GI disease or malignancy  Review of Systems  Constitutional:  Negative for activity change, appetite change, chills, diaphoresis, fatigue, fever and unexpected weight change.  HENT:  Negative for trouble swallowing and voice change.   Respiratory:  Negative for shortness of breath and wheezing.   Cardiovascular:  Negative for chest pain, palpitations and leg swelling.  Gastrointestinal:  Negative for abdominal distention, abdominal pain, anal bleeding, blood in stool, constipation, diarrhea, nausea, rectal pain and vomiting.  Musculoskeletal:  Negative for arthralgias and myalgias.  Skin:  Negative for color change and pallor.  Neurological:  Negative for dizziness, syncope and weakness.  Psychiatric/Behavioral:  Negative for confusion.   All other systems reviewed and are negative.    Medications Medications Ordered Prior to Encounter[1]  Pertinent medications related to GI  and procedure were reviewed by me with the patient prior to the procedure  Current Medications[2]  sodium chloride  20 mL/hr at 03/28/24 0851       Allergies[3] Allergies were reviewed by me prior to the procedure  Objective   Body mass index is 27.91 kg/m. Vitals:   03/28/24 0843  BP: (!) 142/72  Pulse: 86  Resp: 18  Temp: (!) 96 F (35.6 C)  TempSrc: Temporal  SpO2: 100%  Weight: 73.8 kg  Height: 5' 4 (1.626 m)     Physical Exam Vitals and nursing note reviewed.  Constitutional:      General: She is not in acute distress.    Appearance: Normal appearance. She is not ill-appearing, toxic-appearing or diaphoretic.  HENT:     Head: Normocephalic and atraumatic.     Nose: Nose normal.     Mouth/Throat:     Mouth: Mucous membranes are moist.     Pharynx: Oropharynx is clear.  Eyes:     General: No scleral icterus.    Extraocular Movements: Extraocular movements intact.  Cardiovascular:     Rate and Rhythm: Normal rate and regular rhythm.     Heart sounds: Normal heart sounds. No murmur heard.    No friction rub. No gallop.  Pulmonary:     Effort: Pulmonary effort is normal. No respiratory distress.     Breath sounds: Normal breath sounds. No wheezing, rhonchi or rales.  Abdominal:     General: Bowel sounds are normal. There is no distension.     Palpations: Abdomen is soft.     Tenderness: There is no abdominal tenderness. There is  no guarding or rebound.  Musculoskeletal:     Cervical back: Neck supple.     Right lower leg: No edema.     Left lower leg: No edema.  Skin:    General: Skin is warm and dry.     Coloration: Skin is not jaundiced or pale.  Neurological:     General: No focal deficit present.     Mental Status: She is alert and oriented to person, place, and time. Mental status is at baseline.  Psychiatric:        Mood and Affect: Mood normal.        Behavior: Behavior normal.        Thought Content: Thought content normal.        Judgment:  Judgment normal.      Assessment:  Ms. Debra Alexander is a 74 y.o. female  who presents today for Colonoscopy for Colorectal cancer screening; family history of colon cancer-Brother .  Plan:  Colonoscopy with possible intervention today  Colonoscopy with possible biopsy, control of bleeding, polypectomy, and interventions as necessary has been discussed with the patient/patient representative. Informed consent was obtained from the patient/patient representative after explaining the indication, nature, and risks of the procedure including but not limited to death, bleeding, perforation, missed neoplasm/lesions, cardiorespiratory compromise, and reaction to medications. Opportunity for questions was given and appropriate answers were provided. Patient/patient representative has verbalized understanding is amenable to undergoing the procedure.   Elspeth Ozell Jungling, DO  [facility]enter[facility]  Portions of the record may have been created with voice recognition software. Occasional wrong-word or 'sound-a-like' substitutions may have occurred due to the inherent limitations of voice recognition software.  Read the chart carefully and recognize, using context, where substitutions may have occurred.     [1]  No current facility-administered medications on file prior to encounter.   Current Outpatient Medications on File Prior to Encounter  Medication Sig Dispense Refill   alendronate (FOSAMAX) 70 MG tablet Take 70 mg by mouth once a week.     amLODipine (NORVASC) 5 MG tablet Take 5 mg by mouth daily.     buPROPion (WELLBUTRIN XL) 150 MG 24 hr tablet Take 150 mg by mouth daily.     losartan (COZAAR) 50 MG tablet Take 1 tablet by mouth daily.     lovastatin (MEVACOR) 40 MG tablet Take 40 mg by mouth at bedtime.     metFORMIN (GLUCOPHAGE-XR) 500 MG 24 hr tablet Take 1 tablet by mouth daily with supper.     metoprolol succinate (TOPROL-XL) 25 MG 24 hr tablet TAKE 1 TABLET BY MOUTH  EVERY DAY     minoxidil  (LONITEN ) 2.5 MG tablet Take 1 tablet (2.5 mg total) by mouth daily. Take 1/2 tab po QD. 45 tablet 1   Multiple Vitamin (MULTIVITAMIN) tablet Take 1 tablet by mouth daily.     omeprazole (PRILOSEC) 20 MG capsule Take 1 tablet by mouth daily.     albuterol (VENTOLIN HFA) 108 (90 Base) MCG/ACT inhaler Inhale into the lungs.     azelastine (ASTELIN) 0.1 % nasal spray Place into the nose.     Calcium Carb-Cholecalciferol 600-400 MG-UNIT CAPS Take by mouth.     fluticasone (FLONASE) 50 MCG/ACT nasal spray Place into both nostrils daily.     montelukast (SINGULAIR) 10 MG tablet Take 10 mg by mouth daily.     triamcinolone cream (KENALOG) 0.1 % Apply thin film topically 2 times daily as needed to eczema like skin rash     [  DISCONTINUED] ipratropium (ATROVENT ) 0.06 % nasal spray Place 2 sprays into both nostrils 4 (four) times daily as needed for rhinitis. 15 mL 0  [2]  Current Facility-Administered Medications:    0.9 %  sodium chloride  infusion, , Intravenous, Continuous, Onita Elspeth Sharper, DO, Last Rate: 20 mL/hr at 03/28/24 0851, New Bag at 03/28/24 0851 [3]  Allergies Allergen Reactions   Azithromycin  Rash   Atorvastatin Nausea Only   Sulfa Antibiotics    Zolpidem     Other reaction(s): Other (See Comments) sleepwalking   Amoxicillin Rash   "

## 2024-03-28 ENCOUNTER — Ambulatory Visit: Admitting: Anesthesiology

## 2024-03-28 ENCOUNTER — Encounter: Payer: Self-pay | Admitting: Gastroenterology

## 2024-03-28 ENCOUNTER — Other Ambulatory Visit: Payer: Self-pay

## 2024-03-28 ENCOUNTER — Encounter
Admission: RE | Disposition: A | Discharge status: Home / Self Care | Payer: Self-pay | Source: Home / Self Care | Attending: Gastroenterology

## 2024-03-28 ENCOUNTER — Ambulatory Visit
Admission: RE | Admit: 2024-03-28 | Discharge: 2024-03-28 | Disposition: A | Discharge status: Home / Self Care | Attending: Gastroenterology | Admitting: Gastroenterology

## 2024-03-28 DIAGNOSIS — K640 First degree hemorrhoids: Secondary | ICD-10-CM | POA: Diagnosis not present

## 2024-03-28 DIAGNOSIS — Z800 Family history of malignant neoplasm of digestive organs: Secondary | ICD-10-CM | POA: Diagnosis not present

## 2024-03-28 DIAGNOSIS — I10 Essential (primary) hypertension: Secondary | ICD-10-CM | POA: Insufficient documentation

## 2024-03-28 DIAGNOSIS — K621 Rectal polyp: Secondary | ICD-10-CM | POA: Insufficient documentation

## 2024-03-28 DIAGNOSIS — K5730 Diverticulosis of large intestine without perforation or abscess without bleeding: Secondary | ICD-10-CM | POA: Insufficient documentation

## 2024-03-28 DIAGNOSIS — J45909 Unspecified asthma, uncomplicated: Secondary | ICD-10-CM | POA: Diagnosis not present

## 2024-03-28 DIAGNOSIS — D122 Benign neoplasm of ascending colon: Secondary | ICD-10-CM | POA: Insufficient documentation

## 2024-03-28 DIAGNOSIS — Z1211 Encounter for screening for malignant neoplasm of colon: Secondary | ICD-10-CM | POA: Diagnosis present

## 2024-03-28 DIAGNOSIS — Z87891 Personal history of nicotine dependence: Secondary | ICD-10-CM | POA: Diagnosis not present

## 2024-03-28 DIAGNOSIS — D123 Benign neoplasm of transverse colon: Secondary | ICD-10-CM | POA: Insufficient documentation

## 2024-03-28 DIAGNOSIS — M810 Age-related osteoporosis without current pathological fracture: Secondary | ICD-10-CM | POA: Diagnosis not present

## 2024-03-28 HISTORY — PX: COLONOSCOPY: SHX5424

## 2024-03-28 HISTORY — PX: POLYPECTOMY: SHX149

## 2024-03-28 LAB — GLUCOSE, CAPILLARY: Glucose-Capillary: 106 mg/dL — ABNORMAL HIGH (ref 70–99)

## 2024-03-28 SURGERY — COLONOSCOPY
Anesthesia: General

## 2024-03-28 MED ORDER — PROPOFOL 10 MG/ML IV BOLUS
INTRAVENOUS | Status: DC | PRN
  Administered 2024-03-28: 70 mg via INTRAVENOUS

## 2024-03-28 MED ORDER — SODIUM CHLORIDE 0.9 % IV SOLN: INTRAVENOUS | Status: DC

## 2024-03-28 MED ORDER — LIDOCAINE HCL (CARDIAC) PF 100 MG/5ML IV SOSY
PREFILLED_SYRINGE | INTRAVENOUS | Status: DC | PRN
  Administered 2024-03-28: 50 mg via INTRAVENOUS

## 2024-03-28 MED ORDER — PROPOFOL 500 MG/50ML IV EMUL
INTRAVENOUS | Status: DC | PRN
  Administered 2024-03-28: 140 ug/kg/min via INTRAVENOUS

## 2024-03-28 NOTE — Anesthesia Postprocedure Evaluation (Signed)
"   Anesthesia Post Note  Patient: Debra Alexander  Procedure(s) Performed: COLONOSCOPY POLYPECTOMY, INTESTINE  Patient location during evaluation: PACU Anesthesia Type: General Level of consciousness: awake and alert Pain management: satisfactory to patient Vital Signs Assessment: post-procedure vital signs reviewed and stable Respiratory status: spontaneous breathing Cardiovascular status: stable Anesthetic complications: no   No notable events documented.   Last Vitals:  Vitals:   03/28/24 0927 03/28/24 0932  BP:  128/81  Pulse: 73 75  Resp: (!) 24 18  Temp:  (!) 36.1 C  SpO2: 100% 100%    Last Pain:  Vitals:   03/28/24 0932  TempSrc: Temporal  PainSc: 0-No pain                 VAN STAVEREN,Eunice Winecoff      "

## 2024-03-28 NOTE — Anesthesia Preprocedure Evaluation (Signed)
"                                    Anesthesia Evaluation  Patient identified by MRN, date of birth, ID band Patient awake    Reviewed: Allergy & Precautions, NPO status , Patient's Chart, lab work & pertinent test results  Airway Mallampati: II  TM Distance: >3 FB Neck ROM: full    Dental  (+) Teeth Intact   Pulmonary neg pulmonary ROS, asthma , Patient abstained from smoking., former smoker   Pulmonary exam normal        Cardiovascular Exercise Tolerance: Good hypertension, Pt. on medications negative cardio ROS Normal cardiovascular exam Rhythm:Regular Rate:Normal     Neuro/Psych negative neurological ROS  negative psych ROS   GI/Hepatic negative GI ROS, Neg liver ROS,,,  Endo/Other  negative endocrine ROS    Renal/GU negative Renal ROS  negative genitourinary   Musculoskeletal   Abdominal Normal abdominal exam  (+)   Peds negative pediatric ROS (+)  Hematology negative hematology ROS (+)   Anesthesia Other Findings Past Medical History: No date: Hyperlipidemia No date: Hypertension No date: Osteoporosis  Past Surgical History: No date: ABDOMINAL HYSTERECTOMY 08/07/2022: BREAST BIOPSY; Right     Comment:  Stereo Bx, X clip- path pending 08/07/2022: BREAST BIOPSY; Right     Comment:  MM RT BREAST BX W LOC DEV 1ST LESION IMAGE BX SPEC               STEREO GUIDE 08/07/2022 [facility] 2007: BREAST CYST ASPIRATION; Left     Comment:  neg  BMI    Body Mass Index: 27.91 kg/m      Reproductive/Obstetrics negative OB ROS                              Anesthesia Physical Anesthesia Plan  ASA: 2  Anesthesia Plan: General   Post-op Pain Management:    Induction: Intravenous  PONV Risk Score and Plan: Propofol  infusion and TIVA  Airway Management Planned: Natural Airway and Nasal Cannula  Additional Equipment:   Intra-op Plan:   Post-operative Plan:   Informed Consent: I have reviewed the patients  History and Physical, chart, labs and discussed the procedure including the risks, benefits and alternatives for the proposed anesthesia with the patient or authorized representative who has indicated his/her understanding and acceptance.     Dental Advisory Given  Plan Discussed with: CRNA  Anesthesia Plan Comments:         Anesthesia Quick Evaluation  "

## 2024-03-28 NOTE — Transfer of Care (Signed)
 Immediate Anesthesia Transfer of Care Note  Patient: Debra Alexander  Procedure(s) Performed: COLONOSCOPY POLYPECTOMY, INTESTINE  Patient Location: Endoscopy Unit  Anesthesia Type:General  Level of Consciousness: awake  Airway & Oxygen Therapy: Patient Spontanous Breathing  Post-op Assessment: Report given to RN and Post -op Vital signs reviewed and stable  Post vital signs: Reviewed and stable  Last Vitals:  Vitals Value Taken Time  BP 113/82   Temp    Pulse 76 03/28/24 09:22  Resp 32 03/28/24 09:22  SpO2 100 % 03/28/24 09:22  Vitals shown include unfiled device data.  Last Pain:  Vitals:   03/28/24 0843  TempSrc: Temporal  PainSc: 0-No pain         Complications: No notable events documented.

## 2024-03-28 NOTE — Interval H&P Note (Signed)
 History and Physical Interval Note: Preprocedure H&P from 03/28/2024  was reviewed and there was no interval change after seeing and examining the patient.  Written consent was obtained from the patient after discussion of risks, benefits, and alternatives. Patient has consented to proceed with Colonoscopy with possible intervention   03/28/2024 8:53 AM  Debra Alexander  has presented today for surgery, with the diagnosis of Screening for colon cancer (Z12.11) Family hx of colon cancer (Z80.0).  The various methods of treatment have been discussed with the patient and family. After consideration of risks, benefits and other options for treatment, the patient has consented to  Procedures: COLONOSCOPY (N/A) as a surgical intervention.  The patient's history has been reviewed, patient examined, no change in status, stable for surgery.  I have reviewed the patient's chart and labs.  Questions were answered to the patient's satisfaction.     Elspeth Ozell Jungling

## 2024-03-28 NOTE — Op Note (Signed)
 [facility] Patient Name: Debra Alexander Procedure Date: 03/28/2024 8:41 AM MRN: [id number] Account #: [id number] Date of Birth: 1951/02/18 Admit Type: Outpatient Age: 74 Room: [facility] ENDO ROOM 1 Gender: Female Note Status: Finalized Instrument Name: Peds Colonoscope [id number] Procedure:             Colonoscopy Indications:           Screening in patient at increased risk: Family history                         of 1st-degree relative with colorectal cancer Providers:             Elspeth Ozell Jungling DO, DO Medicines:             Monitored Anesthesia Care Complications:         No immediate complications. Estimated blood loss:                         Minimal. Procedure:             Pre-Anesthesia Assessment:                        - Prior to the procedure, a History and Physical was                         performed, and patient medications and allergies were                         reviewed. The patient is competent. The risks and                         benefits of the procedure and the sedation options and                         risks were discussed with the patient. All questions                         were answered and informed consent was obtained.                         Patient identification and proposed procedure were                         verified by the physician, the nurse, the anesthetist                         and the technician in the endoscopy suite. Mental                         Status Examination: alert and oriented. Airway                         Examination: normal oropharyngeal airway and neck                         mobility. Respiratory Examination: clear to                         auscultation. CV Examination: RRR, no murmurs,  no S3                         or S4. Prophylactic Antibiotics: The patient does not                         require prophylactic antibiotics. Prior                         Anticoagulants: The patient  has taken no anticoagulant                         or antiplatelet agents. ASA Grade Assessment: II - A                         patient with mild systemic disease. After reviewing                         the risks and benefits, the patient was deemed in                         satisfactory condition to undergo the procedure. The                         anesthesia plan was to use monitored anesthesia care                         (MAC). Immediately prior to administration of                         medications, the patient was re-assessed for adequacy                         to receive sedatives. The heart rate, respiratory                         rate, oxygen saturations, blood pressure, adequacy of                         pulmonary ventilation, and response to care were                         monitored throughout the procedure. The physical                         status of the patient was re-assessed after the                         procedure.                        After obtaining informed consent, the colonoscope was                         passed under direct vision. Throughout the procedure,                         the patient's blood pressure, pulse, and oxygen  saturations were monitored continuously. The                         Colonoscope was introduced through the anus and                         advanced to the the terminal ileum, with                         identification of the appendiceal orifice and IC                         valve. The colonoscopy was performed without                         difficulty. The patient tolerated the procedure well.                         The quality of the bowel preparation was evaluated                         using the BBPS [facility] Bowel Preparation Scale) with                         scores of: Right Colon = 3, Transverse Colon = 3 and                         Left Colon = 3 (entire mucosa seen well with no                          residual staining, small fragments of stool or opaque                         liquid). The total BBPS score equals 9. The terminal                         ileum, ileocecal valve, appendiceal orifice, and                         rectum were photographed. Findings:      The perianal and digital rectal examinations were normal. Pertinent       negatives include normal sphincter tone.      The terminal ileum appeared normal. Estimated blood loss: none.      Retroflexion in the right colon was performed.      Multiple small-mouthed diverticula were found in the left colon.       Estimated blood loss: none.      Non-bleeding internal hemorrhoids were found during retroflexion. The       hemorrhoids were Grade I (internal hemorrhoids that do not prolapse).       Estimated blood loss: none.      Three sessile polyps were found in the rectum, descending colon and       ascending colon. The polyps were 1 to 2 mm in size. These polyps were       removed with a jumbo cold forceps. Resection and retrieval were       complete. Estimated blood loss was minimal.      A  3 to 4 mm polyp was found in the transverse colon. The polyp was       sessile. The polyp was removed with a cold snare. Resection and       retrieval were complete. Estimated blood loss was minimal.      The exam was otherwise without abnormality on direct and retroflexion       views. Impression:            - The examined portion of the ileum was normal.                        - Diverticulosis in the left colon.                        - Non-bleeding internal hemorrhoids.                        - Three 1 to 2 mm polyps in the rectum, in the                         descending colon and in the ascending colon, removed                         with a jumbo cold forceps. Resected and retrieved.                        - One 3 to 4 mm polyp in the transverse colon, removed                         with a cold snare. Resected and  retrieved.                        - The examination was otherwise normal on direct and                         retroflexion views. Recommendation:        - Patient has a contact number available for                         emergencies. The signs and symptoms of potential                         delayed complications were discussed with the patient.                         Return to normal activities tomorrow. Written                         discharge instructions were provided to the patient.                        - Discharge patient to home.                        - Resume previous diet.                        - Continue present medications.                        -  Await pathology results.                        - Repeat colonoscopy in 5 years for surveillance based                         on pathology results.                        - Return to referring physician as previously                         scheduled.                        - The findings and recommendations were discussed with                         the patient. Procedure Code(s):     --- Professional ---                        [phone], Colonoscopy, flexible; with removal of                         tumor(s), polyp(s), or other lesion(s) by snare                         technique                        45380, 59, Colonoscopy, flexible; with biopsy, single                         or multiple Diagnosis Code(s):     --- Professional ---                        Z80.0, Family history of malignant neoplasm of                         digestive organs                        K64.0, First degree hemorrhoids                        D12.8, Benign neoplasm of rectum                        D12.4, Benign neoplasm of descending colon                        D12.2, Benign neoplasm of ascending colon                        D12.3, Benign neoplasm of transverse colon (hepatic                         flexure or splenic flexure)                         K57.30, Diverticulosis of large intestine without  perforation or abscess without bleeding CPT copyright 2022 American Medical Association. All rights reserved. The codes documented in this report are preliminary and upon coder review may  be revised to meet current compliance requirements. Attending Participation:      I personally performed the entire procedure. Elspeth Jungling, DO Elspeth Ozell Jungling DO, DO 03/28/2024 9:23:19 AM This report has been signed electronically. Number of Addenda: 0 Note Initiated On: 03/28/2024 8:41 AM Scope Withdrawal Time: 0 hours 11 minutes 37 seconds  Total Procedure Duration: 0 hours 15 minutes 49 seconds  Estimated Blood Loss:  Estimated blood loss was minimal.      [facility]

## 2024-03-31 LAB — SURGICAL PATHOLOGY

## 2024-04-14 MED ORDER — SODIUM CHLORIDE 0.9% FLUSH: 3.0000 mL | INTRAVENOUS | Status: DC | PRN

## 2024-04-14 MED ORDER — SODIUM CHLORIDE 0.9 % IV SOLN: 250.0000 mL | INTRAVENOUS | Status: AC | PRN

## 2024-04-14 MED ORDER — SODIUM CHLORIDE 0.9% FLUSH: 3.0000 mL | Freq: Two times a day (BID) | INTRAVENOUS | Status: DC

## 2024-04-14 MED ORDER — SODIUM CHLORIDE 0.9 % IV SOLN: INTRAVENOUS | Status: AC

## 2024-04-15 ENCOUNTER — Encounter: Admission: RE | Payer: Self-pay | Source: Home / Self Care

## 2024-04-15 ENCOUNTER — Ambulatory Visit: Admission: RE | Admit: 2024-04-15 | Source: Home / Self Care | Admitting: Internal Medicine

## 2024-04-15 DIAGNOSIS — I272 Pulmonary hypertension, unspecified: Secondary | ICD-10-CM

## 2024-04-15 DIAGNOSIS — I071 Rheumatic tricuspid insufficiency: Secondary | ICD-10-CM

## 2024-04-18 NOTE — Telephone Encounter (Signed)
 Dupixent  PA submitted through EPIC.       Authorization already on file for this request. Authorization starting on 03/20/2024 and ending on 03/19/2025.

## 2024-04-23 NOTE — Progress Notes (Signed)
 [facility] Refill Coordination Note    Specialty Medication(s) to be Shipped:   CF/Pulmonary/Asthma: Dupixent     Other medication(s) to be shipped: No additional medications requested for fill at this time    Specialty Medications not needed at this time: N/A     Jasmine Carson, DOB: Nov 15, 1950  Phone: [phone] (home)       All above HIPAA information was verified with patient.     Was a nurse, learning disability used for this call? No    Completed refill call assessment today to schedule patient's medication shipment from the [facility]  [phone]).  All relevant notes have been reviewed.     Specialty medication(s) and dose(s) confirmed: Regimen is correct and unchanged.   Changes to medications: Jasmine Carson reports no changes at this time.  Changes to insurance: No  New side effects reported not previously addressed with a pharmacist or physician: None reported  Questions for the pharmacist: No    Confirmed patient received a Conservation Officer, Historic Buildings and a Surveyor, Mining with first shipment. The patient will receive a drug information handout for each medication shipped and additional FDA Medication Guides as required.       DISEASE/MEDICATION-SPECIFIC INFORMATION        N/A    SPECIALTY MEDICATION ADHERENCE     Medication Adherence    Patient reported X missed doses in the last month: 0  Specialty Medication: DUPIXENT  PEN 300 mg/2 mL  Patient is on additional specialty medications: No              Were doses missed due to medication being on hold? No    Dupixent  300/2 mg/ml: 0 doses of medicine on hand        Specialty medication is an injection or given on a cycle: Yes, Next injection is scheduled for 2/9.    REFERRAL TO PHARMACIST     Referral to the pharmacist: Not needed      [facility]     Shipping address confirmed in Epic.     Cost and Payment: Patient has a copay of $4.90. They are aware and have authorized the pharmacy to charge the credit card on file.    Delivery Scheduled: Yes, Expected medication delivery date: 04/25/24.     Medication will be delivered via Next Day Courier to the prescription address in Epic [place].    Jasmine Carson   [facility]  Specialty Technician

## 2024-04-24 MED FILL — DUPIXENT 300 MG/2 ML SUBCUTANEOUS PEN INJECTOR: SUBCUTANEOUS | 28 days supply | Qty: 4 | Fill #3

## 2024-04-30 DIAGNOSIS — I272 Pulmonary hypertension, unspecified: Secondary | ICD-10-CM

## 2024-05-01 ENCOUNTER — Ambulatory Visit: Admission: RE | Admit: 2024-05-01 | Admitting: Internal Medicine

## 2024-05-01 ENCOUNTER — Encounter: Admission: RE | Payer: Self-pay

## 2024-05-01 DIAGNOSIS — I272 Pulmonary hypertension, unspecified: Secondary | ICD-10-CM
